# Patient Record
Sex: Female | Born: 1980
Health system: Southern US, Community
[De-identification: ages and names within clinical notes are randomized; demographics above are authoritative.]

## PROBLEM LIST (undated history)

## (undated) ENCOUNTER — Inpatient Hospital Stay (HOSPITAL_COMMUNITY): Payer: Self-pay

## (undated) DIAGNOSIS — Z8619 Personal history of other infectious and parasitic diseases: Secondary | ICD-10-CM

## (undated) DIAGNOSIS — Z87440 Personal history of urinary (tract) infections: Secondary | ICD-10-CM

## (undated) DIAGNOSIS — R011 Cardiac murmur, unspecified: Secondary | ICD-10-CM

## (undated) DIAGNOSIS — F32A Depression, unspecified: Secondary | ICD-10-CM

## (undated) DIAGNOSIS — T7840XA Allergy, unspecified, initial encounter: Secondary | ICD-10-CM

## (undated) DIAGNOSIS — F329 Major depressive disorder, single episode, unspecified: Secondary | ICD-10-CM

## (undated) DIAGNOSIS — O24419 Gestational diabetes mellitus in pregnancy, unspecified control: Secondary | ICD-10-CM

## (undated) HISTORY — DX: Personal history of urinary (tract) infections: Z87.440

## (undated) HISTORY — DX: Allergy, unspecified, initial encounter: T78.40XA

## (undated) HISTORY — DX: Depression, unspecified: F32.A

## (undated) HISTORY — DX: Personal history of other infectious and parasitic diseases: Z86.19

## (undated) HISTORY — DX: Cardiac murmur, unspecified: R01.1

## (undated) HISTORY — PX: WISDOM TOOTH EXTRACTION: SHX21

## (undated) HISTORY — DX: Major depressive disorder, single episode, unspecified: F32.9

## (undated) HISTORY — DX: Gestational diabetes mellitus in pregnancy, unspecified control: O24.419

---

## 2011-05-17 ENCOUNTER — Ambulatory Visit: Payer: Self-pay | Admitting: Family Medicine

## 2011-05-24 ENCOUNTER — Encounter: Payer: Self-pay | Admitting: Family Medicine

## 2011-05-24 ENCOUNTER — Ambulatory Visit (INDEPENDENT_AMBULATORY_CARE_PROVIDER_SITE_OTHER): Payer: BC Managed Care – PPO | Admitting: Family Medicine

## 2011-05-24 DIAGNOSIS — N809 Endometriosis, unspecified: Secondary | ICD-10-CM | POA: Insufficient documentation

## 2011-05-24 DIAGNOSIS — F329 Major depressive disorder, single episode, unspecified: Secondary | ICD-10-CM

## 2011-05-24 DIAGNOSIS — G43909 Migraine, unspecified, not intractable, without status migrainosus: Secondary | ICD-10-CM

## 2011-05-24 DIAGNOSIS — J029 Acute pharyngitis, unspecified: Secondary | ICD-10-CM

## 2011-05-24 DIAGNOSIS — J45909 Unspecified asthma, uncomplicated: Secondary | ICD-10-CM | POA: Insufficient documentation

## 2011-05-24 MED ORDER — CITALOPRAM HYDROBROMIDE 10 MG PO TABS: ORAL_TABLET | ORAL | Status: DC

## 2011-05-24 MED ORDER — LEVONORGESTREL-ETHINYL ESTRAD 90-20 MCG PO TABS: 1.0000 | ORAL_TABLET | Freq: Every day | ORAL | Status: DC

## 2011-05-24 NOTE — Progress Notes (Signed)
  Subjective:    Patient ID: Elizabeth [name], female    DOB: 1981/03/13, 30 y.o.   MRN: [id number]  HPI New patient to establish care. Patient just moved here from [place] and is teaching high school.  Past medical history significant for mild intermittent asthma, depression, migraine headaches, and endometriosis. Patient gives history that she was admitted twice this past summer in [place] with hives and swelling upper airway. Each time she improved with steroids. She saw allergist and had extensive testing. She had multiple allergies to pollen and cat dander and question of mold allergy. She thinks this may be environmental as she was living in older home with some mold issues. She's had no episodes since moving here. She takes [employer] and has prescription for EpiPen and albuterol inhaler as needed  Patient has depression treated with Celexa 15 mg daily and working well. Requesting refills. Migraine headaches and endometriosis and takes continuous birth control. Requesting refills until she can establish with gynecologist.  Acute issue of generally not doing well past couple weeks with intermittent mild sore throat. Has night sweats but no fever. Frequent strep in the past. Requesting strep screen.   Review of Systems  Constitutional: Positive for diaphoresis and fatigue. Negative for fever, chills and unexpected weight change.  Respiratory: Negative for cough and shortness of breath.   Cardiovascular: Negative for chest pain, palpitations and leg swelling.  Gastrointestinal: Negative for abdominal pain.  Genitourinary: Negative for dysuria.  Neurological: Negative for dizziness and headaches.  Psychiatric/Behavioral: Negative for dysphoric mood.       Objective:   Physical Exam  Constitutional: She is oriented to person, place, and time. She appears well-developed and well-nourished. No distress.  HENT:       Minimal posterior pharynx erythema. She has some trapped  particulate food right tonsillar region but no exudate  Neck: Neck supple.  Cardiovascular: Normal rate and regular rhythm.   No murmur heard. Pulmonary/Chest: Effort normal and breath sounds normal. No respiratory distress. She has no wheezes. She has no rales.  Musculoskeletal: She exhibits no edema.  Neurological: She is alert and oriented to person, place, and time.          Assessment & Plan:  #1 history of severe allergy with probable anaphylaxis, possibly to mold. Patient has seen allergist. Continue antihistamines. She has EpiPen which is in date. #2 history of recurrent depression. Refill Celexa #3 history of endometriosis. Refill Lybrel until she can establish with gynecologist. #4 history of migraine headaches #5 sore throat. Rule out strep.  Rapid strep negative. Treat with OTC meds as needed.

## 2011-10-20 ENCOUNTER — Ambulatory Visit (INDEPENDENT_AMBULATORY_CARE_PROVIDER_SITE_OTHER): Payer: BC Managed Care – PPO | Admitting: Family Medicine

## 2011-10-20 ENCOUNTER — Encounter: Payer: Self-pay | Admitting: Family Medicine

## 2011-10-20 DIAGNOSIS — T781XXA Other adverse food reactions, not elsewhere classified, initial encounter: Secondary | ICD-10-CM

## 2011-10-20 DIAGNOSIS — J45909 Unspecified asthma, uncomplicated: Secondary | ICD-10-CM

## 2011-10-20 DIAGNOSIS — Z91018 Allergy to other foods: Secondary | ICD-10-CM

## 2011-10-20 MED ORDER — CITALOPRAM HYDROBROMIDE 20 MG PO TABS: 20.0000 mg | ORAL_TABLET | Freq: Every day | ORAL | Status: DC

## 2011-10-20 NOTE — Patient Instructions (Signed)
Avoid dairy products for now. Qvar one puff twice daily and rinse mouth after use. Continue with Ventolin as needed Send for records from allergist in [place].

## 2011-10-20 NOTE — Progress Notes (Signed)
  Subjective:    Patient ID: Elizabeth [name], female    DOB: April 24, 1981, 31 y.o.   MRN: [id number]  HPI  Patient seen with possible allergy/asthma issue. Refer to prior note. She had episode this past summer in [place] where she consumed dairy products and had what sounds like posterior pharyngeal edema. She was given epinephrine and steroids but was not admitted and improved. She notices when she is ingests dairy products that she has sensation of swelling. With initial episode last summer she had generalized hives but is not having hives since then. She has not reported any angioedema type reaction with tongue or lips. Her episodes of dairy product intolerance also include frequent coughing. She has history of asthma which has been reportedly mild and intermittent. She's had about one episode per month where she has coughing episodes sometimes lasting several minutes to a couple hours. Never had any syncope.  She thinks she had spirometry this summer but was not sure. Currently takes Ventolin which does seem to help coughing episodes during attacks. She has generally tried to avoid dairy products. She saw allergist this summer in [place] and states she had extensive skin testing with no clear food allergies. She was allergic to cat dander, multiple pollens and possibly mold. We do not have those records at this time. Patient has epipen and takes antihistamines frequently.   Review of Systems  Constitutional: Negative for fever, chills and unexpected weight change.  HENT: Negative for sore throat, trouble swallowing and voice change.   Respiratory: Positive for cough and shortness of breath. Negative for wheezing.   Cardiovascular: Negative for chest pain, palpitations and leg swelling.  Gastrointestinal: Negative for abdominal pain.  Skin: Negative for rash.  Neurological: Negative for light-headedness.       Objective:   Physical Exam  Constitutional: She is oriented to person, place,  and time. She appears well-developed and well-nourished.  HENT:  Right Ear: External ear normal.  Left Ear: External ear normal.  Mouth/Throat: Oropharynx is clear and moist.  Neck: Neck supple. No thyromegaly present.  Cardiovascular: Normal rate and regular rhythm.   Pulmonary/Chest: Effort normal and breath sounds normal. No respiratory distress. She has no wheezes. She has no rales.  Musculoskeletal: She exhibits no edema.  Lymphadenopathy:    She has no cervical adenopathy.  Neurological: She is alert and oriented to person, place, and time.  Skin: No rash noted.          Assessment & Plan:  #1 possible food intolerance to dairy products. Patient describes recurrent sensation of posterior pharyngeal swelling with one especially bad episode last summer associated with generalized hives. Establish with local allergist. Send for old records. Keep EpiPen with her at all times along with Benadryl. Avoid dairy products for now #2 history of asthma. Question intermittent bronchospasm. Attempted spirometry with questionable validity-low FVC and FEV1. Start Qvar 40 mg one puff twice daily with instructions for use. Continue Ventolin as needed

## 2011-11-23 ENCOUNTER — Other Ambulatory Visit: Payer: Self-pay | Admitting: Family Medicine

## 2011-12-11 ENCOUNTER — Other Ambulatory Visit: Payer: Self-pay

## 2011-12-11 ENCOUNTER — Emergency Department (HOSPITAL_COMMUNITY): Payer: BC Managed Care – PPO

## 2011-12-11 ENCOUNTER — Encounter (HOSPITAL_COMMUNITY): Payer: Self-pay | Admitting: Physical Medicine and Rehabilitation

## 2011-12-11 ENCOUNTER — Emergency Department (HOSPITAL_COMMUNITY)
Admission: EM | Admit: 2011-12-11 | Discharge: 2011-12-12 | Disposition: A | Discharge status: Home / Self Care | Payer: BC Managed Care – PPO | Attending: Emergency Medicine | Admitting: Emergency Medicine

## 2011-12-11 DIAGNOSIS — F341 Dysthymic disorder: Secondary | ICD-10-CM | POA: Insufficient documentation

## 2011-12-11 DIAGNOSIS — R1013 Epigastric pain: Secondary | ICD-10-CM | POA: Insufficient documentation

## 2011-12-11 DIAGNOSIS — N390 Urinary tract infection, site not specified: Secondary | ICD-10-CM

## 2011-12-11 DIAGNOSIS — J45909 Unspecified asthma, uncomplicated: Secondary | ICD-10-CM | POA: Insufficient documentation

## 2011-12-11 DIAGNOSIS — Z79899 Other long term (current) drug therapy: Secondary | ICD-10-CM | POA: Insufficient documentation

## 2011-12-11 LAB — CBC
HCT: 34.2 % — ABNORMAL LOW (ref 36.0–46.0)
MCH: 29.7 pg (ref 26.0–34.0)
MCHC: 33.9 g/dL (ref 30.0–36.0)
MCV: 87.5 fL (ref 78.0–100.0)
Platelets: 201 10*3/uL (ref 150–400)
RDW: 12.3 % (ref 11.5–15.5)

## 2011-12-11 LAB — DIFFERENTIAL
Basophils Absolute: 0 10*3/uL (ref 0.0–0.1)
Basophils Relative: 1 % (ref 0–1)
Eosinophils Absolute: 0.2 10*3/uL (ref 0.0–0.7)
Eosinophils Relative: 3 % (ref 0–5)
Monocytes Absolute: 0.5 10*3/uL (ref 0.1–1.0)

## 2011-12-11 LAB — COMPREHENSIVE METABOLIC PANEL
AST: 22 U/L (ref 0–37)
Albumin: 3.5 g/dL (ref 3.5–5.2)
CO2: 23 mEq/L (ref 19–32)
Calcium: 9.2 mg/dL (ref 8.4–10.5)
Creatinine, Ser: 0.65 mg/dL (ref 0.50–1.10)
GFR calc non Af Amer: >90 mL/min (ref >90)
Total Protein: 6.8 g/dL (ref 6.0–8.3)

## 2011-12-11 LAB — URINALYSIS, ROUTINE W REFLEX MICROSCOPIC
Nitrite: NEGATIVE
Protein, ur: NEGATIVE mg/dL
Urobilinogen, UA: 0.2 mg/dL (ref 0.0–1.0)

## 2011-12-11 LAB — URINE MICROSCOPIC-ADD ON

## 2011-12-11 MED ORDER — DEXTROSE 5 % IV SOLN
1.0000 g | Freq: Once | INTRAVENOUS | Status: AC
  Administered 2011-12-11: 1 g via INTRAVENOUS
  Filled 2011-12-11 (×2): qty 10

## 2011-12-11 MED ORDER — GI COCKTAIL ~~LOC~~
30.0000 mL | Freq: Once | ORAL | Status: AC
  Administered 2011-12-11: 30 mL via ORAL
  Filled 2011-12-11: qty 30

## 2011-12-11 MED ORDER — PANTOPRAZOLE SODIUM 40 MG IV SOLR
40.0000 mg | Freq: Once | INTRAVENOUS | Status: AC
  Administered 2011-12-11: 40 mg via INTRAVENOUS
  Filled 2011-12-11: qty 40

## 2011-12-11 MED ORDER — ONDANSETRON HCL 4 MG/2ML IJ SOLN
4.0000 mg | Freq: Once | INTRAMUSCULAR | Status: AC
  Administered 2011-12-11: 4 mg via INTRAVENOUS
  Filled 2011-12-11: qty 2

## 2011-12-11 MED ORDER — HYDROMORPHONE HCL PF 1 MG/ML IJ SOLN
0.5000 mg | Freq: Once | INTRAMUSCULAR | Status: AC
  Administered 2011-12-11: 0.5 mg via INTRAVENOUS
  Filled 2011-12-11: qty 1

## 2011-12-11 MED ORDER — FENTANYL CITRATE 0.05 MG/ML IJ SOLN
50.0000 ug | Freq: Once | INTRAMUSCULAR | Status: AC
  Administered 2011-12-11: 50 ug via INTRAVENOUS
  Filled 2011-12-11: qty 2

## 2011-12-11 MED ORDER — HYDROCODONE-ACETAMINOPHEN 5-325 MG PO TABS: 1.0000 | ORAL_TABLET | Freq: Four times a day (QID) | ORAL | Status: AC | PRN

## 2011-12-11 MED ORDER — SUCRALFATE 1 G PO TABS: 1.0000 g | ORAL_TABLET | Freq: Four times a day (QID) | ORAL | Status: DC

## 2011-12-11 MED ORDER — METOCLOPRAMIDE HCL 10 MG PO TABS: 10.0000 mg | ORAL_TABLET | Freq: Four times a day (QID) | ORAL | Status: DC

## 2011-12-11 MED ORDER — OMEPRAZOLE 20 MG PO CPDR: 20.0000 mg | DELAYED_RELEASE_CAPSULE | Freq: Two times a day (BID) | ORAL | Status: DC

## 2011-12-11 NOTE — ED Provider Notes (Signed)
Medical screening examination/treatment/procedure(s) were performed by non-physician practitioner and as supervising physician I was immediately available for consultation/collaboration.   Dione Booze, MD 12/11/11 [phone]

## 2011-12-11 NOTE — ED Provider Notes (Signed)
She is more comfortable with medications but continues to have pain in epigastric and right upper quadrant area. [place] normal, blood studies stable. GI cocktail given. Dr. Preston Fleeting in to examine. Will discharge home with medications for pain and refer to GI for further evaluation.     Rodena Medin, PA-C 12/11/11 2330

## 2011-12-11 NOTE — ED Notes (Signed)
Pt presents to department for evaluation of epigastric pain. Onset yesterday while at work. States pain has become worse, now 7/10. Denies urinary symptoms. States nausea. Denies fever. Last BM this afternoon. She is alert and oriented x4. Also states "fluttering" sensation to chest. Denies chest pain at the time. Skin warm and dry. No signs of distress noted at the time.

## 2011-12-11 NOTE — ED Provider Notes (Signed)
31 year old female had presented with abdominal pain and evaluated by Dr. Alben Deeds. CBC and chemistry profile is normal and the ultrasound is negative. Patient is examined in maximum tenderness seems to be in the epigastric area. She will be given a trial of a GI cocktail and if she gets relief, sent home on a proton pump inhibitor with referral to gastroenterology.  Dione Booze, MD 12/11/11 [phone]

## 2011-12-11 NOTE — ED Provider Notes (Addendum)
History     CSN: [id number]  Arrival date & time 12/11/11  1551   First MD Initiated Contact with Patient 12/11/11 1740      Chief Complaint  Patient presents with  . Abdominal Pain    (Consider location/radiation/quality/duration/timing/severity/associated sxs/prior treatment) HPI Comments: Patient presents with 2 days of epigastric pain with associated nausea.  Patient had similar symptoms in the past several months ago and her doctors told her she might have an ulcer versus gallstones and recommended that if at worst to have it reevaluated.  He got worse 2 days ago which is why the patient presents here today.  She has not had any emesis.  She notes that the pain does not significantly improve or worsen with eating.  No fevers.  No changes in bowel habits or dysuria.  No chest pain or shortness of breath.  The patient's noted a fluttering sensation in her chest but no lightheadedness or dizziness.  No prior abdominal surgeries.  Patient is a 31 y.o. female presenting with abdominal pain. The history is provided by the patient.  Abdominal Pain The primary symptoms of the illness include abdominal pain and nausea. The primary symptoms of the illness do not include fever, fatigue, shortness of breath, vomiting, diarrhea, hematemesis, hematochezia, dysuria or vaginal discharge. The current episode started 2 days ago. The onset of the illness was gradual. The problem has been gradually worsening.  The patient states that she believes she is currently not pregnant. Symptoms associated with the illness do not include chills or back pain.    Past Medical History  Diagnosis Date  . Asthma   . Depression   . Allergy   . Heart murmur   . Migraine   . Endometriosis     History reviewed. No pertinent past surgical history.  Family History  Problem Relation Age of Onset  . Arthritis Father   . Hypertension Father   . Alcohol abuse Maternal Aunt   . Alcohol abuse Maternal Uncle   .  Alcohol abuse Paternal Aunt   . Alcohol abuse Paternal Uncle   . Hyperlipidemia Maternal Grandmother   . Diabetes Maternal Grandmother   . Arthritis Maternal Grandfather   . Cancer Maternal Grandfather     lung  . Hyperlipidemia Maternal Grandfather   . Miscarriages / Stillbirths Paternal Grandfather   . Hypertension Paternal Grandfather     History  Substance Use Topics  . Smoking status: Never Smoker   . Smokeless tobacco: Not on file  . Alcohol Use: Yes    OB History    Grav Para Term Preterm Abortions TAB SAB Ect Mult Living                  Review of Systems  Constitutional: Negative.  Negative for fever, chills and fatigue.  HENT: Negative.   Eyes: Negative.  Negative for discharge and redness.  Respiratory: Negative.  Negative for cough and shortness of breath.   Cardiovascular: Negative.  Negative for chest pain.  Gastrointestinal: Positive for nausea and abdominal pain. Negative for vomiting, diarrhea, hematochezia and hematemesis.  Genitourinary: Negative.  Negative for dysuria and vaginal discharge.  Musculoskeletal: Negative.  Negative for back pain.  Skin: Negative.  Negative for color change and rash.  Neurological: Negative.  Negative for syncope and headaches.  Hematological: Negative.  Negative for adenopathy.  Psychiatric/Behavioral: Negative.  Negative for confusion.  All other systems reviewed and are negative.    Allergies  Tramadol  Home Medications   Current  Outpatient Rx  Name Route Sig Dispense Refill  . ALBUTEROL SULFATE HFA 108 (90 BASE) MCG/ACT IN AERS Inhalation Inhale 2 puffs into the lungs every 6 (six) hours as needed. For wheezing    . BECLOMETHASONE DIPROPIONATE 40 MCG/ACT IN AERS Inhalation Inhale 2 puffs into the lungs daily.    Marland Kitchen CITALOPRAM HYDROBROMIDE 20 MG PO TABS Oral Take 1 tablet (20 mg total) by mouth daily. 30 tablet 11  . EPINEPHRINE 0.3 MG/0.3ML IJ DEVI Intramuscular Inject 0.3 mg into the muscle once.      Marland Kitchen  FEXOFENADINE HCL 180 MG PO TABS Oral Take 180 mg by mouth daily.      Marland Kitchen LEVONORGESTREL-ETHINYL ESTRAD 90-20 MCG PO TABS Oral Take 1 tablet by mouth daily.      BP 118/73  Pulse 96  Temp(Src) 97.8 F (36.6 C) (Oral)  Resp 20  SpO2 99%  Physical Exam  Nursing note and vitals reviewed. Constitutional: She is oriented to person, place, and time. She appears well-developed and well-nourished.  Non-toxic appearance. She does not have a sickly appearance.  HENT:  Head: Normocephalic and atraumatic.  Eyes: Conjunctivae, EOM and lids are normal. Pupils are equal, round, and reactive to light. No scleral icterus.  Neck: Trachea normal and normal range of motion. Neck supple.  Cardiovascular: Normal rate, regular rhythm and normal heart sounds.   Pulmonary/Chest: Effort normal and breath sounds normal. No respiratory distress. She has no wheezes. She has no rales.  Abdominal: Soft. Normal appearance. There is tenderness. There is no rebound, no guarding and no CVA tenderness.       Mild epigastric tenderness without rebound or guarding  Musculoskeletal: Normal range of motion.  Neurological: She is alert and oriented to person, place, and time. She has normal strength.  Skin: Skin is warm, dry and intact. No rash noted.  Psychiatric: She has a normal mood and affect. Her behavior is normal. Judgment and thought content normal.    ED Course  Procedures (including critical care time) Results for orders placed during the hospital encounter of 12/11/11  URINALYSIS, ROUTINE W REFLEX MICROSCOPIC      Component Value Range   Color, Urine YELLOW  YELLOW    APPearance CLOUDY (*) CLEAR    Specific Gravity, Urine 1.020  1.005 - 1.030    pH 7.5  5.0 - 8.0    Glucose, UA NEGATIVE  NEGATIVE (mg/dL)   Hgb urine dipstick NEGATIVE  NEGATIVE    Bilirubin Urine NEGATIVE  NEGATIVE    Ketones, ur NEGATIVE  NEGATIVE (mg/dL)   Protein, ur NEGATIVE  NEGATIVE (mg/dL)   Urobilinogen, UA 0.2  0.0 - 1.0 (mg/dL)     Nitrite NEGATIVE  NEGATIVE    Leukocytes, UA LARGE (*) NEGATIVE   POCT PREGNANCY, URINE      Component Value Range   Preg Test, Ur NEGATIVE  NEGATIVE   CBC      Component Value Range   WBC 6.9  4.0 - 10.5 (K/uL)   RBC 3.91  3.87 - 5.11 (MIL/uL)   Hemoglobin 11.6 (*) 12.0 - 15.0 (g/dL)   HCT 16.1 (*) 09.6 - 46.0 (%)   MCV 87.5  78.0 - 100.0 (fL)   MCH 29.7  26.0 - 34.0 (pg)   MCHC 33.9  30.0 - 36.0 (g/dL)   RDW 04.5  40.9 - 81.1 (%)   Platelets 201  150 - 400 (K/uL)  DIFFERENTIAL      Component Value Range   Neutrophils Relative 58  43 - 77 (%)   Neutro Abs 4.0  1.7 - 7.7 (K/uL)   Lymphocytes Relative 33  12 - 46 (%)   Lymphs Abs 2.3  0.7 - 4.0 (K/uL)   Monocytes Relative 7  3 - 12 (%)   Monocytes Absolute 0.5  0.1 - 1.0 (K/uL)   Eosinophils Relative 3  0 - 5 (%)   Eosinophils Absolute 0.2  0.0 - 0.7 (K/uL)   Basophils Relative 1  0 - 1 (%)   Basophils Absolute 0.0  0.0 - 0.1 (K/uL)  COMPREHENSIVE METABOLIC PANEL      Component Value Range   Sodium 137  135 - 145 (mEq/L)   Potassium 3.6  3.5 - 5.1 (mEq/L)   Chloride 104  96 - 112 (mEq/L)   CO2 23  19 - 32 (mEq/L)   Glucose, Bld 75  70 - 99 (mg/dL)   BUN 8  6 - 23 (mg/dL)   Creatinine, Ser 1.61  0.50 - 1.10 (mg/dL)   Calcium 9.2  8.4 - 09.6 (mg/dL)   Total Protein 6.8  6.0 - 8.3 (g/dL)   Albumin 3.5  3.5 - 5.2 (g/dL)   AST 22  0 - 37 (U/L)   ALT 28  0 - 35 (U/L)   Alkaline Phosphatase 71  39 - 117 (U/L)   Total Bilirubin 0.3  0.3 - 1.2 (mg/dL)   GFR calc non Af Amer >90  >90 (mL/min)   GFR calc Af Amer >90  >90 (mL/min)  LIPASE, BLOOD      Component Value Range   Lipase 31  11 - 59 (U/L)  URINE MICROSCOPIC-ADD ON      Component Value Range   Squamous Epithelial / LPF FEW (*) RARE    WBC, UA 7-10  <3 (WBC/hpf)   RBC / HPF 0-2  <3 (RBC/hpf)   Bacteria, UA MANY (*) RARE    Crystals CA OXALATE CRYSTALS (*) NEGATIVE    No results found.    MDM  Patient with epigastric pain with associated nausea that may be  related to possible GERD, peptic ulcer disease, pancreatitis, cholelithiasis or cholecystitis.  Patient is afebrile at this time.  I will obtain laboratory studies to assess for the above noted possibilities as well as an abdominal ultrasound to assess for gallstones or cholecystitis.        Nat Christen, MD 12/11/11 1749  Patient's urinalysis is consistent with UTI at this time so she's been given a dose of ceftriaxone here.  Patient's LFTs and lipase are normal.  I am obtaining an ultrasound to rule out gallstones at this time.  If the patient's pain and nausea is improved I anticipate she'll be able to be discharged with antibiotics for a UTI.  If her ultrasound is negative she will likely be able to go home on a PPI and followup with her primary care physician.  If her ultrasound shows gallstones she may need followup with her primary care physician as well as a Careers adviser as an outpatient.  Nat Christen, MD 12/11/11 1950

## 2011-12-11 NOTE — Discharge Instructions (Signed)
YOU CAN BE DISCHARGED HOME TO FOLLOW UP WITH DR. MANN FOR FURTHER EVALUATION OF ABDOMINAL PAIN. TAKE MEDICATIONS AS PRESCRIBED. RETURN HERE WITH ANY HIGH FEVER, SEVERE PAIN OR NEW CONCERN.  Abdominal Pain Abdominal pain can be caused by many things. Your caregiver decides the seriousness of your pain by an examination and possibly blood tests and X-rays. Many cases can be observed and treated at home. Most abdominal pain is not caused by a disease and will probably improve without treatment. However, in many cases, more time must pass before a clear cause of the pain can be found. Before that point, it may not be known if you need more testing, or if hospitalization or surgery is needed. HOME CARE INSTRUCTIONS   Do not take laxatives unless directed by your caregiver.   Take pain medicine only as directed by your caregiver.   Only take over-the-counter or prescription medicines for pain, discomfort, or fever as directed by your caregiver.   Try a clear liquid diet (broth, tea, or water) for as long as directed by your caregiver. Slowly move to a bland diet as tolerated.  SEEK IMMEDIATE MEDICAL CARE IF:   The pain does not go away.   You have a fever.   You keep throwing up (vomiting).   The pain is felt only in portions of the abdomen. Pain in the right side could possibly be appendicitis. In an adult, pain in the left lower portion of the abdomen could be colitis or diverticulitis.   You pass bloody or black tarry stools.  MAKE SURE YOU:   Understand these instructions.   Will watch your condition.   Will get help right away if you are not doing well or get worse.  Document Released: 06/02/2005 Document Revised: 08/12/2011 Document Reviewed: 04/10/2008 [facility] Patient Information 2012 [place], [place].

## 2012-01-04 ENCOUNTER — Encounter: Payer: Self-pay | Admitting: Family Medicine

## 2012-01-04 ENCOUNTER — Ambulatory Visit (INDEPENDENT_AMBULATORY_CARE_PROVIDER_SITE_OTHER): Payer: BC Managed Care – PPO | Admitting: Family Medicine

## 2012-01-04 VITALS — BP 120/80 | Temp 98.3°F | Wt 182.0 lb

## 2012-01-04 DIAGNOSIS — R1013 Epigastric pain: Secondary | ICD-10-CM

## 2012-01-04 DIAGNOSIS — J029 Acute pharyngitis, unspecified: Secondary | ICD-10-CM

## 2012-01-04 MED ORDER — ONDANSETRON 8 MG PO TBDP: 8.0000 mg | ORAL_TABLET | Freq: Three times a day (TID) | ORAL | Status: AC | PRN

## 2012-01-04 MED ORDER — PANTOPRAZOLE SODIUM 40 MG PO TBEC: 40.0000 mg | DELAYED_RELEASE_TABLET | Freq: Every day | ORAL | Status: DC

## 2012-01-04 NOTE — Progress Notes (Signed)
  Subjective:    Patient ID: Elizabeth [name], female    DOB: [birth year], 31 y.o.   MRN: [id number]  HPI  Patient seen for the following issues  She relates today history of sore throat. History of frequent strep pharyngitis in the past. She's had minimal nasal congestion. Occasional headaches. Intermittent dry cough. Sore throat symptoms are mild to moderate. Does not take any medications. No difficulty swallowing.  She relates some intermittent nausea and vomiting the past couple days. No diarrhea. Recurrent epigastric pain. Was seen emergency department earlier this month with similar epigastric pain. Ultrasound unremarkable. Patient placed on omeprazole and symptoms improved greatly but only took  this about one week. No history of peptic ulcer disease. No nonsteroidal use. No alcohol use. Nonsmoker. Denies any recent melena or hematemesis. No orthostasis. She was referred to gastroenterologist but never went  Past Medical History  Diagnosis Date  . Asthma   . Depression   . Allergy   . Heart murmur   . Migraine   . Endometriosis    No past surgical history on file.  reports that she has never smoked. She does not have any smokeless tobacco history on file. She reports that she drinks alcohol. She reports that she does not use illicit drugs. family history includes Alcohol abuse in her maternal aunt, maternal uncle, paternal aunt, and paternal uncle; Arthritis in her father and maternal grandfather; Cancer in her maternal grandfather; Diabetes in her maternal grandmother; Hyperlipidemia in her maternal grandfather and maternal grandmother; Hypertension in her father and paternal grandfather; and Miscarriages / [place] in her paternal grandfather. Allergies  Allergen Reactions  . Tramadol Nausea And Vomiting    GI upset      Review of Systems  Constitutional: Positive for appetite change. Negative for fever, chills and unexpected weight change.  HENT: Positive for congestion  and sore throat. Negative for trouble swallowing.   Respiratory: Positive for cough.   Gastrointestinal: Positive for nausea, vomiting and abdominal pain. Negative for diarrhea, constipation, blood in stool and abdominal distention.  Genitourinary: Negative for dysuria.  Skin: Negative for rash.       Objective:   Physical Exam  Constitutional: She is oriented to person, place, and time. She appears well-developed and well-nourished.  HENT:  Right Ear: External ear normal.  Left Ear: External ear normal.       Patient is a very small aphthous ulcer right soft palate region. No exudate  Neck: Neck supple.  Cardiovascular: Normal rate and regular rhythm.   Pulmonary/Chest: Effort normal and breath sounds normal. No respiratory distress. She has no wheezes. She has no rales.  Abdominal: Soft. Bowel sounds are normal. She exhibits no mass. There is no rebound and no guarding.       Patient has some midepigastric tenderness. No mass.  Lymphadenopathy:    She has no cervical adenopathy.  Neurological: She is alert and oriented to person, place, and time.  Skin: No rash noted.  Psychiatric: She has a normal mood and affect. Her behavior is normal.          Assessment & Plan:  #1 sore throat. Small aphthous ulcer. Rapid strep negative. Treat symptomatically. Avoid nonsteroidals for reasons that below #2 recurrent epigastric pain. Recent ultrasound unremarkable. No evidence for active bleeding. No specific risk factors for gastritis. Prescription for Zofran for nausea 8 mg every 8 hours as needed. Protonix 40 mg once daily. She is encouraged to go and schedule GI followup

## 2012-01-04 NOTE — Patient Instructions (Signed)
Canker Sores  Canker sores are painful, open sores on the inside of the mouth and cheek. They may be white or yellow. The sores usually heal in 1 to 2 weeks. Women are more likely than men to have recurrent canker sores. CAUSES The cause of canker sores is not well understood. More than one cause is likely. Canker sores do not appear to be caused by certain types of germs (viruses or bacteria). Canker sores may be caused by:  An allergic reaction to certain foods.   Digestive problems.   Not having enough vitamin B12, folic acid, and iron.   Female sex hormones. Sores may come only during certain phases of a menstrual cycle. Often, there is improvement during pregnancy.   Genetics. Some people seem to inherit canker sore problems.  Emotional stress and injuries to the mouth may trigger outbreaks, but not cause them.  DIAGNOSIS Canker sores are diagnosed by exam.  TREATMENT  Patients who have frequent bouts of canker sores may have cultures taken of the sores, blood tests, or allergy tests. This helps determine if their sores are caused by a poor diet, an allergy, or some other preventable or treatable disease.   Vitamins may prevent recurrences or reduce the severity of canker sores in people with poor nutrition.   Numbing ointments can relieve pain. These are available in drug stores without a prescription.   Anti-inflammatory steroid mouth rinses or gels may be prescribed by your caregiver for severe sores.   Oral steroids may be prescribed if you have severe, recurrent canker sores. These strong medicines can cause many side effects and should be used only under the close direction of a dentist or physician.   Mouth rinses containing the antibiotic medicine may be prescribed. They may lessen symptoms and speed healing.  Healing usually happens in about 1 or 2 weeks with or without treatment. Certain antibiotic mouth rinses given to pregnant women and young children can permanently  stain teeth. Talk to your caregiver about your treatment. HOME CARE INSTRUCTIONS   Avoid foods that cause canker sores for you.   Avoid citrus juices, spicy or salty foods, and coffee until the sores are healed.   Use a soft-bristled toothbrush.   Chew your food carefully to avoid biting your cheek.   Apply topical numbing medicine to the sore to help relieve pain.   Apply a thin paste of baking soda and water to the sore to help heal the sore.   Only use mouth rinses or medicines for pain or discomfort as directed by your caregiver.  SEEK MEDICAL CARE IF:   Your symptoms are not better in 1 week.   Your sores are still present after 2 weeks.   Your sores are very painful.   You have trouble breathing or swallowing.   Your sores come back frequently.  Document Released: 12/18/2010 Document Revised: 08/12/2011 Document Reviewed: 12/18/2010 [facility] Patient Information 2012 [place], [place].  Schedule follow up with gastroenterologist. Follow up immediately for any melena, or vomiting blood or any progressive abdominal pain.

## 2012-03-10 ENCOUNTER — Telehealth: Payer: Self-pay | Admitting: Family Medicine

## 2012-03-10 MED ORDER — CITALOPRAM HYDROBROMIDE 20 MG PO TABS: 20.0000 mg | ORAL_TABLET | Freq: Every day | ORAL | Status: DC

## 2012-03-10 NOTE — Telephone Encounter (Signed)
Pt informed Rx called into pharamacy requested

## 2012-03-10 NOTE — Telephone Encounter (Signed)
OK to refill

## 2012-03-10 NOTE — Telephone Encounter (Signed)
Patient called stating that she is on vacation and has left her rx for celexa 20mg  at home and would like to know if the MD would call her in a refill to [facility] [address] or [address] [place], [place]. Ph. [phone]. Please advise and inform patient of advise as she will be leaving tomorrow to go to another state.

## 2012-06-21 ENCOUNTER — Other Ambulatory Visit: Payer: Self-pay | Admitting: Family Medicine

## 2012-07-24 IMAGING — US US ABDOMEN COMPLETE
1 series · 14 of 25 positions shown · non-contrast
Comparison: None.

CLINICAL DATA: Epigastric abdominal pain, nausea

COMPLETE ABDOMINAL ULTRASOUND

[Series 1: us abdomen complete · 0.33mm/px · 14 of 65 slices shown]
[im 1/65]
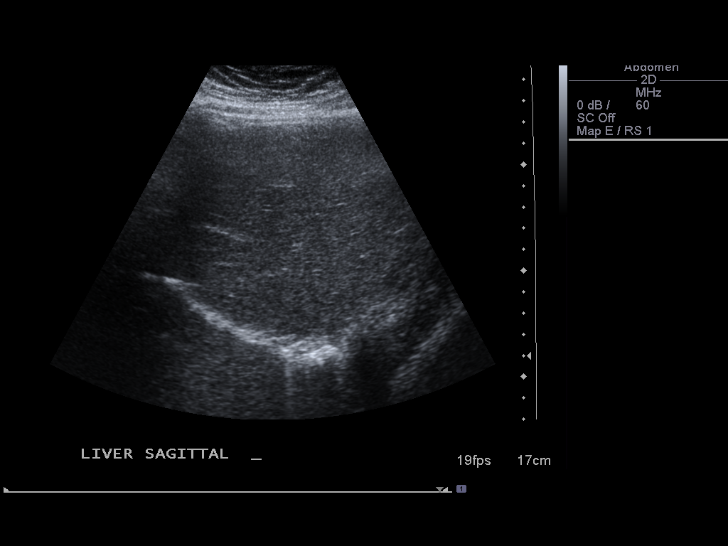
[im 6/65]
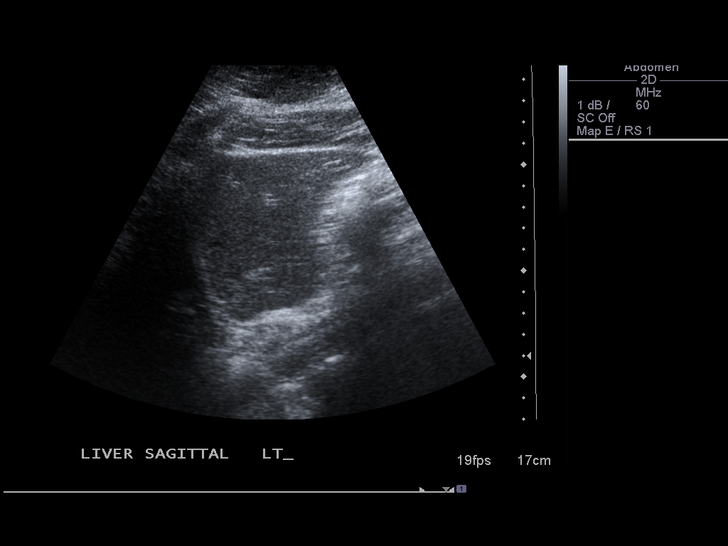
[im 11/65]
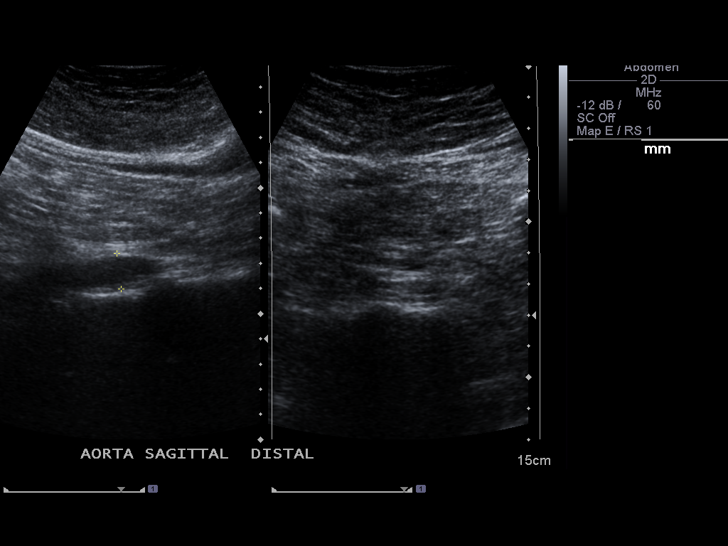
[im 17/65]
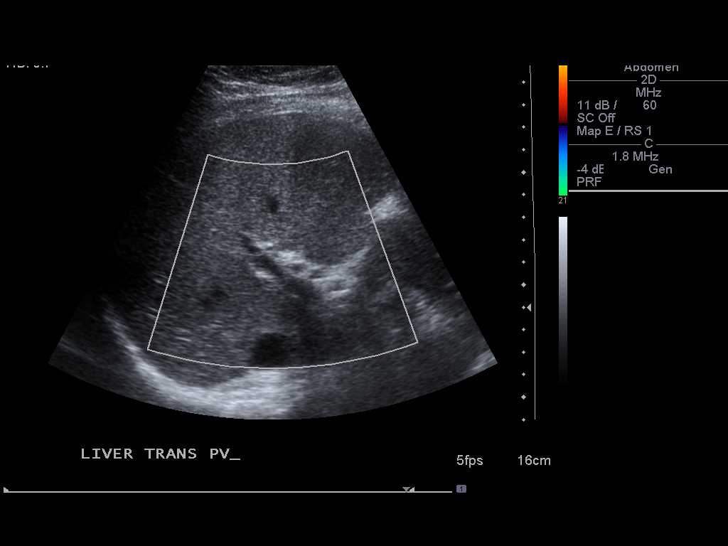
[im 22/65]
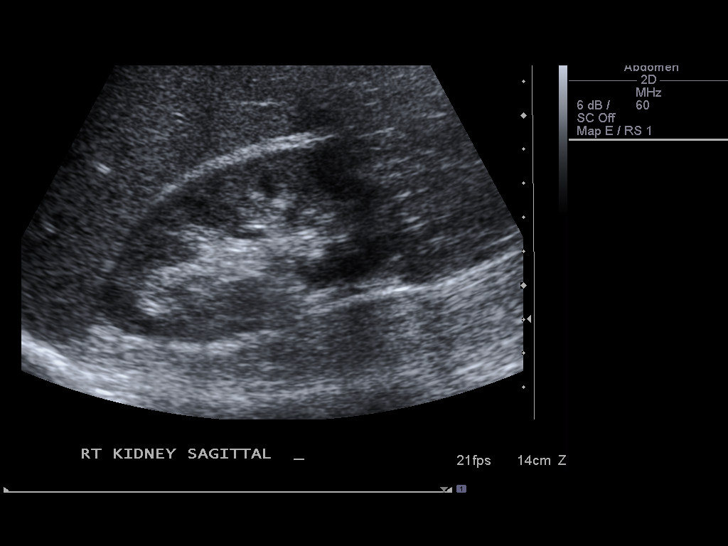
[im 25/65]
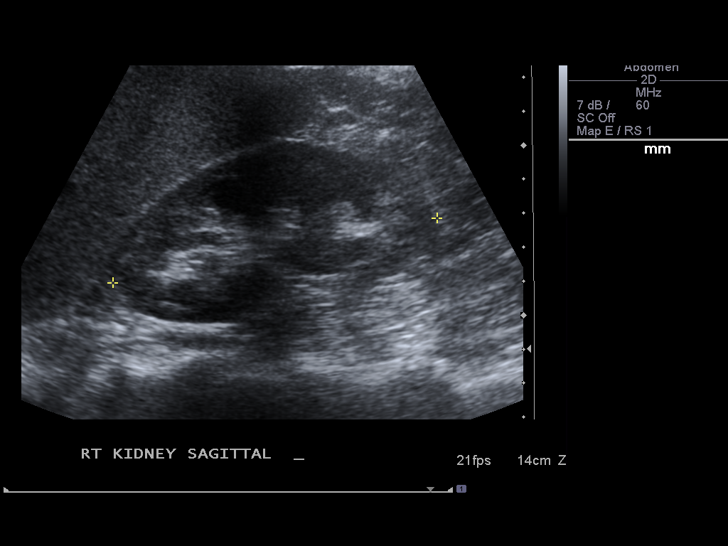
[im 30/65]
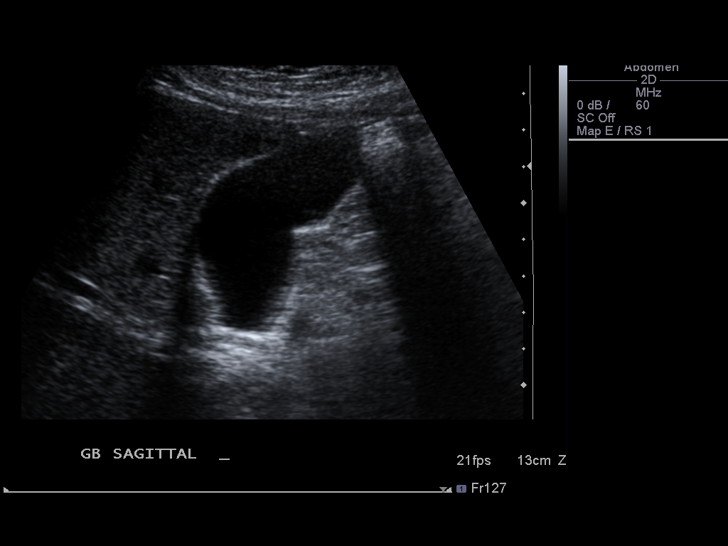
[im 35/65]
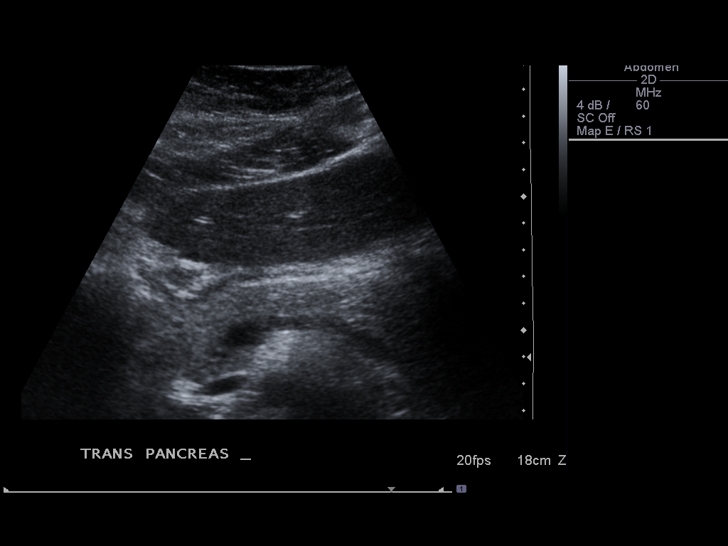
[im 41/65]
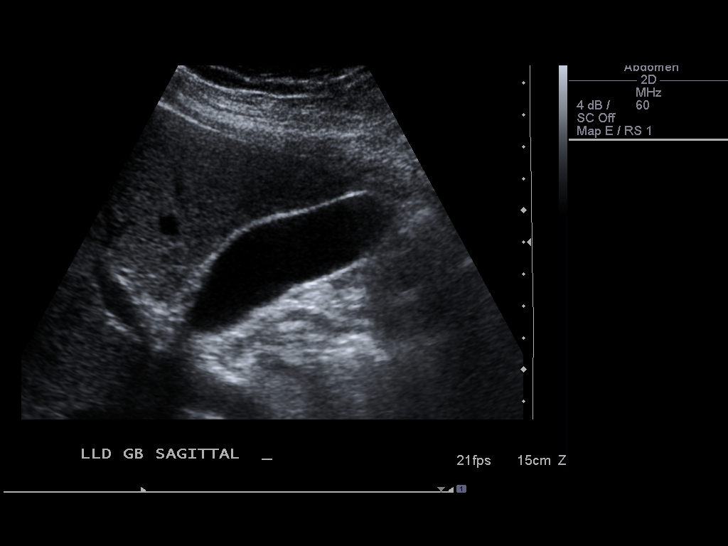
[im 43/65]
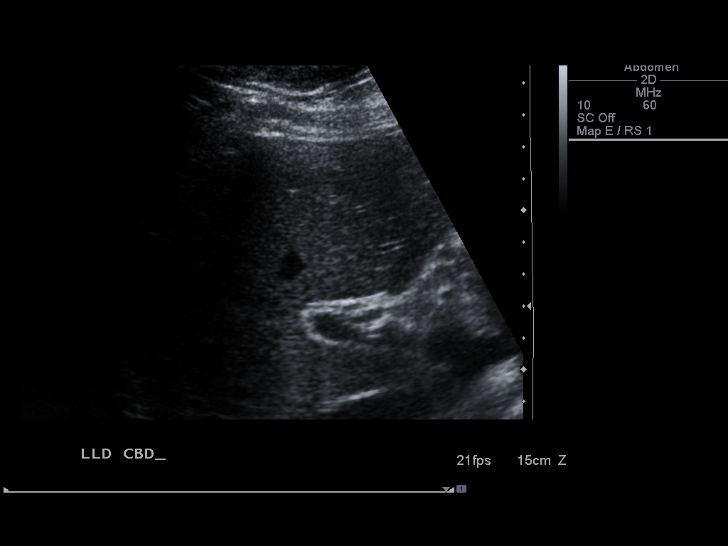
[im 49/65]
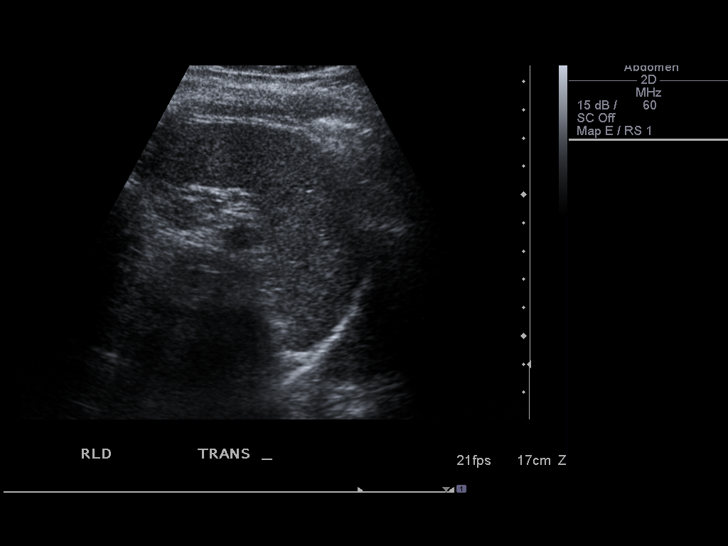
[im 54/65]
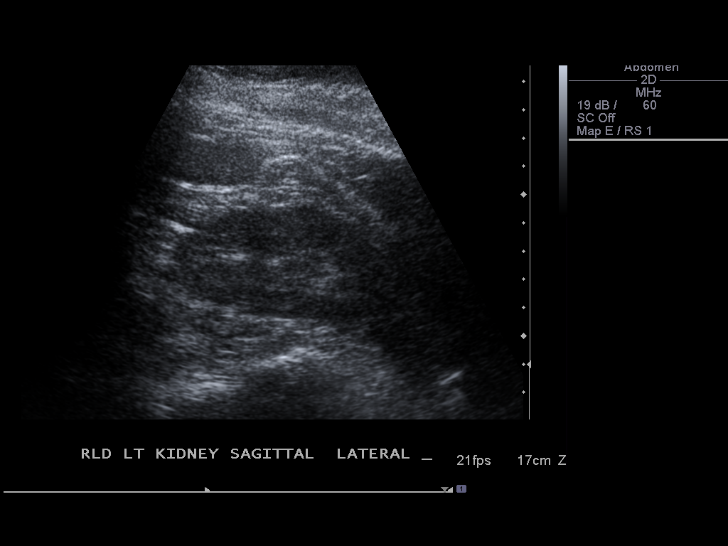
[im 59/65]
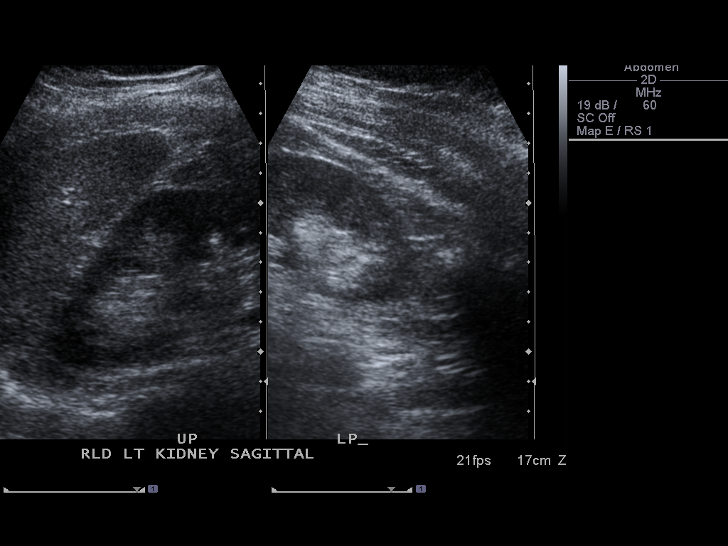
[im 65/65]
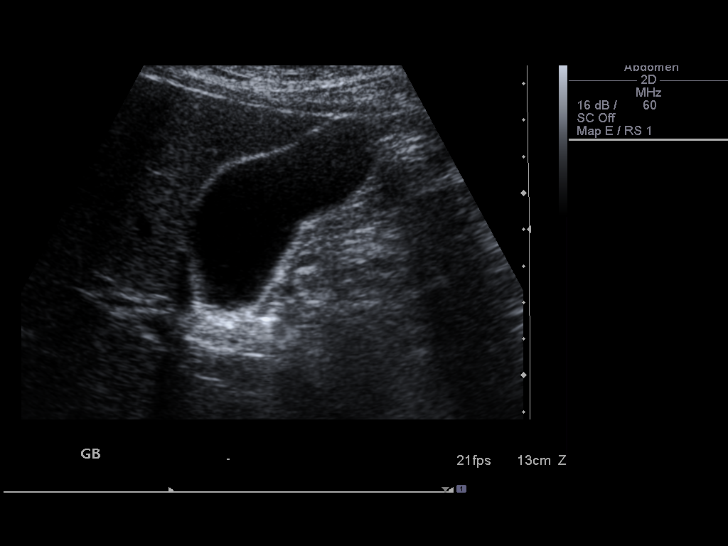

[14 of 25 positions shown; findings below may reference images not displayed]

FINDINGS: Gallbladder:  No gallstones, gallbladder wall thickening, or
pericholecystic fluid.  Negative sonographic Murphy's sign.

Common bile duct:  Measures 3 mm.

Liver:  No focal lesion identified.  Within normal limits in
parenchymal echogenicity.

IVC:  Appears normal.

Pancreas:  Incompletely visualized but grossly unremarkable.

Spleen:  Measures 9.0 cm.  Suspected splenule in the splenic hilum.

Right Kidney:  Measures 10.0 cm.  No mass or hydronephrosis.

Left Kidney:  Measures 10.5 cm.  No mass or hydronephrosis.

Abdominal aorta:  No aneurysm identified.
IMPRESSION: Negative abdominal ultrasound.

## 2012-08-17 ENCOUNTER — Other Ambulatory Visit: Payer: Self-pay | Admitting: Family Medicine

## 2012-11-10 ENCOUNTER — Other Ambulatory Visit: Payer: Self-pay | Admitting: Family Medicine

## 2013-01-11 LAB — OB RESULTS CONSOLE HIV ANTIBODY (ROUTINE TESTING): HIV: NONREACTIVE

## 2013-01-11 LAB — OB RESULTS CONSOLE ANTIBODY SCREEN: Antibody Screen: NEGATIVE

## 2013-01-11 LAB — OB RESULTS CONSOLE ABO/RH: RH Type: POSITIVE

## 2013-01-11 LAB — OB RESULTS CONSOLE RUBELLA ANTIBODY, IGM: Rubella: IMMUNE

## 2013-01-13 ENCOUNTER — Other Ambulatory Visit: Payer: Self-pay | Admitting: Family Medicine

## 2013-05-05 ENCOUNTER — Inpatient Hospital Stay (HOSPITAL_COMMUNITY)
Admission: AD | Admit: 2013-05-05 | Discharge: 2013-05-05 | Disposition: A | Discharge status: Home / Self Care | Payer: BC Managed Care – PPO | Source: Ambulatory Visit | Attending: Obstetrics and Gynecology | Admitting: Obstetrics and Gynecology

## 2013-05-05 ENCOUNTER — Encounter (HOSPITAL_COMMUNITY): Payer: Self-pay | Admitting: *Deleted

## 2013-05-05 DIAGNOSIS — O2390 Unspecified genitourinary tract infection in pregnancy, unspecified trimester: Secondary | ICD-10-CM | POA: Insufficient documentation

## 2013-05-05 DIAGNOSIS — O368190 Decreased fetal movements, unspecified trimester, not applicable or unspecified: Secondary | ICD-10-CM | POA: Insufficient documentation

## 2013-05-05 DIAGNOSIS — B373 Candidiasis of vulva and vagina: Secondary | ICD-10-CM

## 2013-05-05 DIAGNOSIS — O368120 Decreased fetal movements, second trimester, not applicable or unspecified: Secondary | ICD-10-CM

## 2013-05-05 DIAGNOSIS — B3731 Acute candidiasis of vulva and vagina: Secondary | ICD-10-CM | POA: Insufficient documentation

## 2013-05-05 LAB — WET PREP, GENITAL: Trich, Wet Prep: NONE SEEN

## 2013-05-05 NOTE — MAU Note (Signed)
Pt C/O spotting yesterday, none today, had been taking vaginal yeast infection medication.  Decreased fetal movement since last night.

## 2013-05-05 NOTE — MAU Provider Note (Signed)
History     CSN: [id number]  Arrival date and time: 05/05/13 1191   First Provider Initiated Contact with Patient 05/05/13 1106      Chief Complaint  Patient presents with  . Decreased Fetal Movement   HPI Elizabeth [name] 32 y.o. [redacted]w[redacted]d  Comes to MAU with decreased fetal movement and one episode of a small amount of bright red bleeding.  Is currently treating for a yeast infection.  No bleeding today but was worried that the baby was not moving well.  OB History   Grav Para Term Preterm Abortions TAB SAB Ect Mult Living   1               Past Medical History  Diagnosis Date  . Asthma   . Depression   . Allergy   . Heart murmur   . Migraine   . Endometriosis     Past Surgical History  Procedure Laterality Date  . Wisdom tooth extraction      Family History  Problem Relation Age of Onset  . Arthritis Father   . Hypertension Father   . Alcohol abuse Maternal Aunt   . Alcohol abuse Maternal Uncle   . Alcohol abuse Paternal Aunt   . Alcohol abuse Paternal Uncle   . Hyperlipidemia Maternal Grandmother   . Diabetes Maternal Grandmother   . Arthritis Maternal Grandfather   . Cancer Maternal Grandfather     lung  . Hyperlipidemia Maternal Grandfather   . Miscarriages / Stillbirths Paternal Grandfather   . Hypertension Paternal Grandfather     History  Substance Use Topics  . Smoking status: Never Smoker   . Smokeless tobacco: Not on file  . Alcohol Use: No    Allergies:  Allergies  Allergen Reactions  . Tramadol Nausea And Vomiting    GI upset    Prescriptions prior to admission  Medication Sig Dispense Refill  . calcium carbonate (TUMS EX) 750 MG chewable tablet Chew 2 tablets by mouth daily as needed for heartburn.      . Prenatal Vit-Fe Fumarate-FA (PRENATAL MULTIVITAMIN) TABS tablet Take 1 tablet by mouth daily at 12 noon.      Marland Kitchen terconazole (TERAZOL 7) 0.4 % vaginal cream Place 1 applicator vaginally at bedtime.      Marland Kitchen albuterol (PROVENTIL  HFA;VENTOLIN HFA) 108 (90 BASE) MCG/ACT inhaler Inhale 2 puffs into the lungs every 6 (six) hours as needed. For wheezing      . beclomethasone (QVAR) 40 MCG/ACT inhaler Inhale 2 puffs into the lungs daily.      Marland Kitchen EPINEPHrine (EPI-PEN) 0.3 mg/0.3 mL DEVI Inject 0.3 mg into the muscle once.          Review of Systems  Constitutional: Negative for fever.  Gastrointestinal: Negative for nausea, vomiting and abdominal pain.  Genitourinary:       No vaginal discharge. Vaginal bleeding. No dysuria.     Physical Exam   Blood pressure 131/66, pulse 90, temperature 97.1 F (36.2 C), temperature source Oral, resp. rate 16.  Physical Exam  Nursing note and vitals reviewed. Constitutional: She is oriented to person, place, and time. She appears well-developed and well-nourished.  HENT:  Head: Normocephalic.  Eyes: EOM are normal.  Neck: Neck supple.  GI: Soft. There is no tenderness. There is no rebound and no guarding.  Monitor strip reviewed.  FHT 140 baseline and 10X10 accels noted.  Client is now feeling the baby move well.  Genitourinary:  Speculum exam: Vagina - Small amount of  creamy discharge, no odor Cervix - No contact bleeding Bimanual exam: Cervix closed and thick Uterus gravid wet prep done Chaperone present for exam.  Musculoskeletal: Normal range of motion.  Neurological: She is alert and oriented to person, place, and time.  Skin: Skin is warm and dry.  Psychiatric: She has a normal mood and affect.    MAU Course  Procedures  MDM Results for orders placed during the hospital encounter of 05/05/13 (from the past 24 hour(s))  WET PREP, GENITAL     Status: Abnormal   Collection Time    05/05/13 11:19 AM      Result Value Range   Yeast Wet Prep HPF POC NONE SEEN  NONE SEEN   Trich, Wet Prep NONE SEEN  NONE SEEN   Clue Cells Wet Prep HPF POC NONE SEEN  NONE SEEN   WBC, Wet Prep HPF POC FEW (*) NONE SEEN    Assessment and Plan  Fetal movement appropriate for  gestational age. No vaginal bleeding Current yeast infection with prior prescribed treatment.  Plan Follow up in the office Call your doctor if there is decreased movement or you have any additional vaginal bleeding.  [name] 05/05/2013, 12:43 PM

## 2013-06-20 ENCOUNTER — Encounter: Payer: BC Managed Care – PPO | Attending: Obstetrics and Gynecology

## 2013-06-20 VITALS — Ht 62.0 in | Wt 197.1 lb

## 2013-06-20 DIAGNOSIS — Z713 Dietary counseling and surveillance: Secondary | ICD-10-CM | POA: Insufficient documentation

## 2013-06-20 DIAGNOSIS — O99810 Abnormal glucose complicating pregnancy: Secondary | ICD-10-CM | POA: Insufficient documentation

## 2013-06-25 ENCOUNTER — Other Ambulatory Visit (HOSPITAL_COMMUNITY): Payer: Self-pay | Admitting: Obstetrics and Gynecology

## 2013-06-25 DIAGNOSIS — R109 Unspecified abdominal pain: Secondary | ICD-10-CM

## 2013-06-26 ENCOUNTER — Ambulatory Visit (HOSPITAL_COMMUNITY)
Admission: RE | Admit: 2013-06-26 | Discharge: 2013-06-26 | Disposition: A | Discharge status: Home / Self Care | Payer: BC Managed Care – PPO | Source: Ambulatory Visit | Attending: Obstetrics and Gynecology | Admitting: Obstetrics and Gynecology

## 2013-06-26 DIAGNOSIS — R109 Unspecified abdominal pain: Secondary | ICD-10-CM | POA: Insufficient documentation

## 2013-06-26 DIAGNOSIS — O99891 Other specified diseases and conditions complicating pregnancy: Secondary | ICD-10-CM | POA: Insufficient documentation

## 2013-08-03 ENCOUNTER — Inpatient Hospital Stay (HOSPITAL_COMMUNITY)
Admission: AD | Admit: 2013-08-03 | Discharge: 2013-08-03 | Disposition: A | Discharge status: Home / Self Care | Payer: 59 | Source: Ambulatory Visit | Attending: Obstetrics and Gynecology | Admitting: Obstetrics and Gynecology

## 2013-08-03 DIAGNOSIS — O99810 Abnormal glucose complicating pregnancy: Secondary | ICD-10-CM | POA: Insufficient documentation

## 2013-08-03 NOTE — Progress Notes (Signed)
Reactive NST completed.  Dr Langston Masker called- doing procedure- will call back after.

## 2013-08-03 NOTE — Progress Notes (Signed)
Pt presents as MAU overflow for biweekly NST due to Gestational Diabetes on Glyburide.  Glyburide was 2.5 mg po in evening but has recently cut that dose in half.  States CBG's WNL except for some elevated fasting levels.

## 2013-08-03 NOTE — Progress Notes (Signed)
NST called to Dr Langston Masker- pt d/c home undelivered with labor precautions, kick counts, office appt sched for Mon.

## 2013-08-03 NOTE — Progress Notes (Signed)
Patient comes in today for NST secondary to A2DM.  She has no c/o.  Last NST was Tuesday and non-reactive for which she had BPP 8/10.    Reactive NST today  32yo G1 at [redacted]w[redacted]d with A2DM for monitoring -F/U in office as scheduled -Kick counts

## 2013-08-14 ENCOUNTER — Encounter (HOSPITAL_COMMUNITY): Payer: Self-pay | Admitting: *Deleted

## 2013-08-14 ENCOUNTER — Inpatient Hospital Stay (HOSPITAL_COMMUNITY)
Admission: AD | Admit: 2013-08-14 | Discharge: 2013-08-14 | Disposition: A | Discharge status: Home / Self Care | Payer: 59 | Source: Ambulatory Visit | Attending: Obstetrics and Gynecology | Admitting: Obstetrics and Gynecology

## 2013-08-14 DIAGNOSIS — O99891 Other specified diseases and conditions complicating pregnancy: Secondary | ICD-10-CM | POA: Insufficient documentation

## 2013-08-14 DIAGNOSIS — K5289 Other specified noninfective gastroenteritis and colitis: Secondary | ICD-10-CM | POA: Insufficient documentation

## 2013-08-14 DIAGNOSIS — O212 Late vomiting of pregnancy: Secondary | ICD-10-CM | POA: Insufficient documentation

## 2013-08-14 DIAGNOSIS — M549 Dorsalgia, unspecified: Secondary | ICD-10-CM | POA: Insufficient documentation

## 2013-08-14 DIAGNOSIS — K529 Noninfective gastroenteritis and colitis, unspecified: Secondary | ICD-10-CM

## 2013-08-14 LAB — COMPREHENSIVE METABOLIC PANEL
AST: 18 U/L (ref 0–37)
Albumin: 2.4 g/dL — ABNORMAL LOW (ref 3.5–5.2)
Alkaline Phosphatase: 136 U/L — ABNORMAL HIGH (ref 39–117)
BUN: 8 mg/dL (ref 6–23)
CO2: 21 mEq/L (ref 19–32)
Chloride: 102 mEq/L (ref 96–112)
GFR calc non Af Amer: >90 mL/min (ref >90)
Potassium: 3.5 mEq/L (ref 3.5–5.1)
Total Bilirubin: 0.6 mg/dL (ref 0.3–1.2)

## 2013-08-14 LAB — URINALYSIS, ROUTINE W REFLEX MICROSCOPIC
Glucose, UA: NEGATIVE mg/dL
Ketones, ur: NEGATIVE mg/dL
Protein, ur: NEGATIVE mg/dL
Urobilinogen, UA: 0.2 mg/dL (ref 0.0–1.0)

## 2013-08-14 LAB — URINE MICROSCOPIC-ADD ON

## 2013-08-14 MED ORDER — DEXTROSE 5 % IN LACTATED RINGERS IV BOLUS
1000.0000 mL | Freq: Once | INTRAVENOUS | Status: AC
  Administered 2013-08-14: 1000 mL via INTRAVENOUS

## 2013-08-14 MED ORDER — ONDANSETRON 4 MG PO TBDP: 4.0000 mg | ORAL_TABLET | Freq: Three times a day (TID) | ORAL | Status: DC | PRN

## 2013-08-14 MED ORDER — PROMETHAZINE HCL 25 MG PO TABS: 25.0000 mg | ORAL_TABLET | Freq: Four times a day (QID) | ORAL | Status: DC | PRN

## 2013-08-14 MED ORDER — PROMETHAZINE HCL 25 MG/ML IJ SOLN
12.5000 mg | Freq: Once | INTRAMUSCULAR | Status: AC
  Administered 2013-08-14: 12.5 mg via INTRAVENOUS
  Filled 2013-08-14: qty 1

## 2013-08-14 NOTE — MAU Note (Addendum)
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

## 2013-08-14 NOTE — MAU Note (Signed)
Arrival @ [phone] via EMS, no bleeding, good FM, [place] started 0830 every 2 minutes

## 2013-08-14 NOTE — MAU Note (Signed)
During SVE and palpating for contraction, pt experiencing pain in mid upper abd. cx closed, hx gallbladder US  2 months ago

## 2013-08-14 NOTE — MAU Provider Note (Signed)
History     CSN: [id number]  Arrival date and time: 08/14/13 0943   None     Chief Complaint  Patient presents with  . Labor Eval   HPI [name] is a 32 y.o. female G1P0 at [redacted]w[redacted]d who presents with nausea, vomiting, back pain and ?contractions. Pt was teaching and started feeling nauseous; pt began feeling very faint. She started feeling significant bilateral back pain. She started shaking and at the time a co-worker checked her blood sugar and it was 105. She vomited 8-9 times, pt has not vomited since she has been here. Pt's co-worker called 911 and patient presented via EMS. Denies diarrhea. She reports good fetal movement, denies LOF, vaginal bleeding, vaginal itching/burning, urinary symptoms, h/a.   OB History   Grav Para Term Preterm Abortions TAB SAB Ect Mult Living   1               Past Medical History  Diagnosis Date  . Asthma   . Depression   . Allergy   . Heart murmur   . Migraine   . Endometriosis   . Gestational diabetes     Past Surgical History  Procedure Laterality Date  . Wisdom tooth extraction      Family History  Problem Relation Age of Onset  . Arthritis Father   . Hypertension Father   . Alcohol abuse Maternal Aunt   . Alcohol abuse Maternal Uncle   . Alcohol abuse Paternal Aunt   . Alcohol abuse Paternal Uncle   . Hyperlipidemia Maternal Grandmother   . Diabetes Maternal Grandmother   . Arthritis Maternal Grandfather   . Cancer Maternal Grandfather     lung  . Hyperlipidemia Maternal Grandfather   . Miscarriages / Stillbirths Paternal Grandfather   . Hypertension Paternal Grandfather     History  Substance Use Topics  . Smoking status: Never Smoker   . Smokeless tobacco: Not on file  . Alcohol Use: No    Allergies:  Allergies  Allergen Reactions  . Tramadol Nausea And Vomiting    GI upset    Prescriptions prior to admission  Medication Sig Dispense Refill  . calcium carbonate (TUMS EX) 750 MG chewable  tablet Chew 2 tablets by mouth daily as needed for heartburn.      . glyBURIDE (DIABETA) 1.25 MG tablet Take 1.25 mg by mouth daily.      . Prenatal Vit-Fe Fumarate-FA (PRENATAL MULTIVITAMIN) TABS tablet Take 1 tablet by mouth daily at 12 noon.      Marland Kitchen albuterol (PROVENTIL HFA;VENTOLIN HFA) 108 (90 BASE) MCG/ACT inhaler Inhale 2 puffs into the lungs every 6 (six) hours as needed. For wheezing      . EPINEPHrine (EPI-PEN) 0.3 mg/0.3 mL DEVI Inject 0.3 mg into the muscle once.         Results for orders placed during the hospital encounter of 08/14/13 (from the past 24 hour(s))  URINALYSIS, ROUTINE W REFLEX MICROSCOPIC     Status: Abnormal   Collection Time    08/14/13  9:50 AM      Result Value Range   Color, Urine YELLOW  YELLOW   APPearance HAZY (*) CLEAR   Specific Gravity, Urine 1.015  1.005 - 1.030   pH 6.0  5.0 - 8.0   Glucose, UA NEGATIVE  NEGATIVE mg/dL   Hgb urine dipstick NEGATIVE  NEGATIVE   Bilirubin Urine NEGATIVE  NEGATIVE   Ketones, ur NEGATIVE  NEGATIVE mg/dL   Protein, ur NEGATIVE  NEGATIVE mg/dL   Urobilinogen, UA 0.2  0.0 - 1.0 mg/dL   Nitrite NEGATIVE  NEGATIVE   Leukocytes, UA MODERATE (*) NEGATIVE  URINE MICROSCOPIC-ADD ON     Status: Abnormal   Collection Time    08/14/13  9:50 AM      Result Value Range   Squamous Epithelial / LPF MANY (*) RARE   WBC, UA 3-6  <3 WBC/hpf   RBC / HPF 0-2  <3 RBC/hpf   Bacteria, UA RARE  RARE   Urine-Other MUCOUS PRESENT    COMPREHENSIVE METABOLIC PANEL     Status: Abnormal   Collection Time    08/14/13 12:42 PM      Result Value Range   Sodium 133 (*) 135 - 145 mEq/L   Potassium 3.5  3.5 - 5.1 mEq/L   Chloride 102  96 - 112 mEq/L   CO2 21  19 - 32 mEq/L   Glucose, Bld 224 (*) 70 - 99 mg/dL   BUN 8  6 - 23 mg/dL   Creatinine, Ser 7.84  0.50 - 1.10 mg/dL   Calcium 8.4  8.4 - 69.6 mg/dL   Total Protein 6.2  6.0 - 8.3 g/dL   Albumin 2.4 (*) 3.5 - 5.2 g/dL   AST 18  0 - 37 U/L   ALT 18  0 - 35 U/L   Alkaline Phosphatase  136 (*) 39 - 117 U/L   Total Bilirubin 0.6  0.3 - 1.2 mg/dL   GFR calc non Af Amer >90  >90 mL/min   GFR calc Af Amer >90  >90 mL/min    Review of Systems  Constitutional: Positive for fever and chills.  Gastrointestinal: Positive for nausea, vomiting and abdominal pain. Negative for heartburn, diarrhea and constipation.  Genitourinary: Negative for dysuria, urgency, frequency and hematuria.       No vaginal discharge. No vaginal bleeding. No dysuria.   Neurological: Positive for dizziness. Negative for headaches.   Physical Exam   Blood pressure 113/61, pulse 82, temperature 97.8 F (36.6 C), temperature source Oral, resp. rate 18, height 5\' 2"  (1.575 m), weight 87.998 kg (194 lb).  Physical Exam  Constitutional: She appears well-developed and well-nourished. No distress.  HENT:  Head: Normocephalic.  Eyes: Pupils are equal, round, and reactive to light.  Neck: Neck supple.  Respiratory: Effort normal.  GI: Soft.  Skin: She is diaphoretic.    Fetal Tracing: Baseline: 130 bpm Variability: Moderate  Accelerations: 15x15 Decelerations: None  Toco: UI   Dilation: Fingertip Effacement (%): Thick Cervical Position: Posterior Station: -2 Exam by:: Gwenyth Bender, RNC    MAU Course  Procedures None  MDM D5LR bolus Phenergan 12.5 mg IM CMET  Consulted with Dr. Henderson Cloud; ok to discharge patient home with nausea medication.  Assessment and Plan   A: 1. Gastroenteritis, acute     P: Discharge home Stay home from work 24-48 hours  RX: Zofran        Phenergan  Return to MAU as needed, if symptoms worsen Follow up with physician as scheduled.  Limit sugar intake throughout the rest of the day; smart water is a good option for hydration.   [name] IRENE NP  08/14/2013, 5:15 PM

## 2013-08-23 ENCOUNTER — Telehealth (HOSPITAL_COMMUNITY): Payer: Self-pay | Admitting: *Deleted

## 2013-08-23 ENCOUNTER — Encounter (HOSPITAL_COMMUNITY): Payer: Self-pay | Admitting: *Deleted

## 2013-08-23 LAB — OB RESULTS CONSOLE GBS: GBS: NEGATIVE

## 2013-08-23 NOTE — Telephone Encounter (Signed)
Preadmission screen  

## 2013-08-24 ENCOUNTER — Encounter (HOSPITAL_COMMUNITY): Payer: Self-pay

## 2013-08-24 ENCOUNTER — Inpatient Hospital Stay (HOSPITAL_COMMUNITY)
Admission: RE | Admit: 2013-08-24 | Discharge: 2013-08-28 | DRG: 766 | Disposition: A | Discharge status: Home / Self Care | Payer: 59 | Source: Ambulatory Visit | Attending: Obstetrics and Gynecology | Admitting: Obstetrics and Gynecology

## 2013-08-24 DIAGNOSIS — O99814 Abnormal glucose complicating childbirth: Principal | ICD-10-CM | POA: Diagnosis present

## 2013-08-24 LAB — CBC
HCT: 34.2 % — ABNORMAL LOW (ref 36.0–46.0)
MCH: 28.7 pg (ref 26.0–34.0)
MCV: 85.3 fL (ref 78.0–100.0)
Platelets: 181 10*3/uL (ref 150–400)
RBC: 4.01 MIL/uL (ref 3.87–5.11)
RDW: 14.7 % (ref 11.5–15.5)

## 2013-08-24 MED ORDER — LACTATED RINGERS IV SOLN
INTRAVENOUS | Status: DC
  Administered 2013-08-25: 125 mL/h via INTRAVENOUS
  Administered 2013-08-26 (×4): via INTRAVENOUS

## 2013-08-24 MED ORDER — ZOLPIDEM TARTRATE 5 MG PO TABS
5.0000 mg | ORAL_TABLET | Freq: Every evening | ORAL | Status: DC | PRN
  Administered 2013-08-26: 5 mg via ORAL
  Filled 2013-08-24: qty 1

## 2013-08-24 MED ORDER — IBUPROFEN 600 MG PO TABS: 600.0000 mg | ORAL_TABLET | Freq: Four times a day (QID) | ORAL | Status: DC | PRN

## 2013-08-24 MED ORDER — MISOPROSTOL 25 MCG QUARTER TABLET
25.0000 ug | ORAL_TABLET | ORAL | Status: DC | PRN
  Administered 2013-08-24 – 2013-08-26 (×5): 25 ug via VAGINAL
  Filled 2013-08-24 (×5): qty 0.25

## 2013-08-24 MED ORDER — OXYTOCIN 40 UNITS IN LACTATED RINGERS INFUSION - SIMPLE MED: 62.5000 mL/h | INTRAVENOUS | Status: DC

## 2013-08-24 MED ORDER — CITRIC ACID-SODIUM CITRATE 334-500 MG/5ML PO SOLN
30.0000 mL | ORAL | Status: DC | PRN
  Filled 2013-08-24 (×2): qty 15

## 2013-08-24 MED ORDER — TERBUTALINE SULFATE 1 MG/ML IJ SOLN: 0.2500 mg | Freq: Once | INTRAMUSCULAR | Status: AC | PRN

## 2013-08-24 MED ORDER — OXYTOCIN BOLUS FROM INFUSION: 500.0000 mL | INTRAVENOUS | Status: DC

## 2013-08-24 MED ORDER — FLEET ENEMA 7-19 GM/118ML RE ENEM: 1.0000 | ENEMA | RECTAL | Status: DC | PRN

## 2013-08-24 MED ORDER — LACTATED RINGERS IV SOLN
500.0000 mL | INTRAVENOUS | Status: DC | PRN
  Administered 2013-08-26: 500 mL via INTRAVENOUS

## 2013-08-24 MED ORDER — LIDOCAINE HCL (PF) 1 % IJ SOLN: 30.0000 mL | INTRAMUSCULAR | Status: DC | PRN

## 2013-08-24 MED ORDER — ACETAMINOPHEN 325 MG PO TABS
650.0000 mg | ORAL_TABLET | ORAL | Status: DC | PRN
  Administered 2013-08-26: 650 mg via ORAL
  Filled 2013-08-24: qty 2

## 2013-08-24 MED ORDER — ONDANSETRON HCL 4 MG/2ML IJ SOLN: 4.0000 mg | Freq: Four times a day (QID) | INTRAMUSCULAR | Status: DC | PRN

## 2013-08-24 MED ORDER — OXYCODONE-ACETAMINOPHEN 5-325 MG PO TABS: 1.0000 | ORAL_TABLET | ORAL | Status: DC | PRN

## 2013-08-24 NOTE — H&P (Signed)
Elizabeth [name], Elizabeth [name]          ACCOUNT NO.:  [id number]  MEDICAL RECORD NO.:  [id number]  LOCATION:                                 FACILITY:  PHYSICIAN:  Duke Salvia. Marcelle [name], Elizabeth [name].DATE OF BIRTH:  Mar 15, 1981  DATE OF ADMISSION:  08/24/2013 DATE OF DISCHARGE:                             HISTORY & PHYSICAL   CHIEF COMPLAINT:  For labor induction at term.  HPI:  A 32 year old, G1, P0, EDD is December 19.  The patient's pregnancy has been complicated by gestational diabetes and has required the use of glyburide with good CBG control.  She has had weekly reactive nonstress tests.  She presents now at her EDD for labor induction.  Last cervical exam was 1, 50%, -3.  GBS was negative.  PAST MEDICAL HISTORY:  Please see the [facility] form for details.  PHYSICAL EXAMINATION:  VITAL SIGNS:  Temp 98.2, blood pressure 110/60. HEENT:  Unremarkable. NECK:  Supple without masses. LUNGS:  Clear. CARDIOVASCULAR:  Regular rate and rhythm without murmurs, rubs, or gallops. BREASTS:  Not examined. PELVIC:  Term fundal height.  Fetal heart rate 140.  Cervix was 1, 50%, -3, membranes intact. EXTREMITIES:  Unremarkable. NEUROLOGIC:  Unremarkable.  IMPRESSION:  Term pregnancy, well-controlled gestational diabetes requiring glyburide.  PLAN:  Two-stage labor induction.  This procedure including specific risks related to possibility of a 2 day labor induction, increased cesarean risks for failed induction discussed with her, which she understands and accepts.     Elizabeth [name], Elizabeth [name].     RMH/MEDQ  D:  08/24/2013  T:  08/24/2013  Job:  [id number]

## 2013-08-25 ENCOUNTER — Inpatient Hospital Stay (HOSPITAL_COMMUNITY): Payer: 59

## 2013-08-25 LAB — TYPE AND SCREEN: Antibody Screen: NEGATIVE

## 2013-08-25 MED ORDER — TERBUTALINE SULFATE 1 MG/ML IJ SOLN: 0.2500 mg | Freq: Once | INTRAMUSCULAR | Status: AC | PRN

## 2013-08-25 MED ORDER — OXYTOCIN 40 UNITS IN LACTATED RINGERS INFUSION - SIMPLE MED
1.0000 m[IU]/min | INTRAVENOUS | Status: DC
  Administered 2013-08-25: 2 m[IU]/min via INTRAVENOUS
  Filled 2013-08-25 (×2): qty 1000

## 2013-08-25 NOTE — H&P (Signed)
Elizabeth [name]  DICTATION # [id number] CSN# [id number]   Meriel Pica, MD 08/25/2013 8:38 AM

## 2013-08-25 NOTE — Progress Notes (Signed)
Received Cytotec X 2 last evening, pitocin today w/ attempted AROM>>>vtx too high.  On pitocin today w/o any signif change, still 2+/50/post/-2, stable FHR after pit turned off recently.  Offered either rest + repeat Cytotec for second day induction or CS.  Pt _ husband agree that their preference is rest, eat>>restart Cytotec this evening

## 2013-08-25 NOTE — Progress Notes (Signed)
Oxygen removed

## 2013-08-25 NOTE — Progress Notes (Signed)
Applying fetal heart monitors

## 2013-08-25 NOTE — Progress Notes (Signed)
[id number] RN worked with [employer] to work.  Monitor changed out once and two beacons set up.  FHR traced intermittently with external u/s

## 2013-08-25 NOTE — Progress Notes (Signed)
Beacon discontinued, not tracing consistently

## 2013-08-25 NOTE — Progress Notes (Signed)
MD attempted to place IFSE to AROM No fluid return

## 2013-08-25 NOTE — Progress Notes (Signed)
Oxygen off

## 2013-08-26 ENCOUNTER — Encounter (HOSPITAL_COMMUNITY): Payer: Self-pay

## 2013-08-26 ENCOUNTER — Inpatient Hospital Stay (HOSPITAL_COMMUNITY): Payer: 59 | Admitting: Anesthesiology

## 2013-08-26 ENCOUNTER — Encounter (HOSPITAL_COMMUNITY): Payer: 59 | Admitting: Anesthesiology

## 2013-08-26 ENCOUNTER — Encounter (HOSPITAL_COMMUNITY)
Admission: RE | Disposition: A | Discharge status: Home / Self Care | Payer: Self-pay | Source: Ambulatory Visit | Attending: Obstetrics and Gynecology

## 2013-08-26 LAB — CBC
Hemoglobin: 13.2 g/dL (ref 12.0–15.0)
MCHC: 34.4 g/dL (ref 30.0–36.0)
MCV: 85.5 fL (ref 78.0–100.0)
Platelets: 180 10*3/uL (ref 150–400)
RBC: 4.49 MIL/uL (ref 3.87–5.11)

## 2013-08-26 SURGERY — Surgical Case
Anesthesia: Epidural | Site: Abdomen

## 2013-08-26 MED ORDER — LACTATED RINGERS IV SOLN
500.0000 mL | Freq: Once | INTRAVENOUS | Status: AC
  Administered 2013-08-26: 500 mL via INTRAVENOUS

## 2013-08-26 MED ORDER — CEFAZOLIN SODIUM-DEXTROSE 2-3 GM-% IV SOLR
INTRAVENOUS | Status: DC | PRN
  Administered 2013-08-26: 2 g via INTRAVENOUS

## 2013-08-26 MED ORDER — KETOROLAC TROMETHAMINE 30 MG/ML IJ SOLN: 30.0000 mg | Freq: Four times a day (QID) | INTRAMUSCULAR | Status: AC | PRN

## 2013-08-26 MED ORDER — LANOLIN HYDROUS EX OINT: 1.0000 "application " | TOPICAL_OINTMENT | CUTANEOUS | Status: DC | PRN

## 2013-08-26 MED ORDER — EPHEDRINE 5 MG/ML INJ
10.0000 mg | INTRAVENOUS | Status: DC | PRN
  Filled 2013-08-26: qty 4

## 2013-08-26 MED ORDER — ONDANSETRON HCL 4 MG/2ML IJ SOLN
INTRAMUSCULAR | Status: DC | PRN
  Administered 2013-08-26: 4 mg via INTRAVENOUS

## 2013-08-26 MED ORDER — DIBUCAINE 1 % RE OINT: 1.0000 "application " | TOPICAL_OINTMENT | RECTAL | Status: DC | PRN

## 2013-08-26 MED ORDER — SIMETHICONE 80 MG PO CHEW
80.0000 mg | CHEWABLE_TABLET | Freq: Three times a day (TID) | ORAL | Status: DC
  Administered 2013-08-26 – 2013-08-28 (×6): 80 mg via ORAL
  Filled 2013-08-26 (×5): qty 1

## 2013-08-26 MED ORDER — FENTANYL 2.5 MCG/ML BUPIVACAINE 1/10 % EPIDURAL INFUSION (WH - ANES)
INTRAMUSCULAR | Status: DC | PRN
  Administered 2013-08-26: 14 mL/h via EPIDURAL

## 2013-08-26 MED ORDER — OXYCODONE-ACETAMINOPHEN 5-325 MG PO TABS
1.0000 | ORAL_TABLET | Freq: Once | ORAL | Status: AC
  Administered 2013-08-26: 1 via ORAL
  Filled 2013-08-26: qty 1

## 2013-08-26 MED ORDER — SODIUM CHLORIDE 0.9 % IJ SOLN: 3.0000 mL | INTRAMUSCULAR | Status: DC | PRN

## 2013-08-26 MED ORDER — ONDANSETRON HCL 4 MG PO TABS: 4.0000 mg | ORAL_TABLET | ORAL | Status: DC | PRN

## 2013-08-26 MED ORDER — HYDROMORPHONE HCL PF 1 MG/ML IJ SOLN
INTRAMUSCULAR | Status: AC
  Administered 2013-08-26: 0.5 mg via INTRAVENOUS
  Filled 2013-08-26: qty 1

## 2013-08-26 MED ORDER — CEFAZOLIN SODIUM 1-5 GM-% IV SOLN
1.0000 g | Freq: Three times a day (TID) | INTRAVENOUS | Status: DC
  Filled 2013-08-26 (×2): qty 50

## 2013-08-26 MED ORDER — SENNOSIDES-DOCUSATE SODIUM 8.6-50 MG PO TABS
2.0000 | ORAL_TABLET | ORAL | Status: DC
  Administered 2013-08-26 – 2013-08-28 (×2): 2 via ORAL
  Filled 2013-08-26 (×2): qty 2

## 2013-08-26 MED ORDER — NALBUPHINE HCL 10 MG/ML IJ SOLN
5.0000 mg | INTRAMUSCULAR | Status: DC | PRN
  Filled 2013-08-26 (×2): qty 1

## 2013-08-26 MED ORDER — EPHEDRINE 5 MG/ML INJ
10.0000 mg | INTRAVENOUS | Status: AC | PRN
  Administered 2013-08-26 (×2): 10 mg via INTRAVENOUS

## 2013-08-26 MED ORDER — ZOLPIDEM TARTRATE 5 MG PO TABS: 5.0000 mg | ORAL_TABLET | Freq: Every evening | ORAL | Status: DC | PRN

## 2013-08-26 MED ORDER — OXYCODONE-ACETAMINOPHEN 5-325 MG PO TABS
1.0000 | ORAL_TABLET | Freq: Four times a day (QID) | ORAL | Status: DC | PRN
Start: 2013-08-26 — End: 2013-08-28
  Administered 2013-08-27: 2 via ORAL
  Administered 2013-08-27 (×2): 1 via ORAL
  Administered 2013-08-27 – 2013-08-28 (×4): 2 via ORAL
  Filled 2013-08-26 (×5): qty 2
  Filled 2013-08-26 (×2): qty 1
  Filled 2013-08-26: qty 2

## 2013-08-26 MED ORDER — OXYTOCIN 10 UNIT/ML IJ SOLN
INTRAMUSCULAR | Status: AC
  Filled 2013-08-26: qty 4

## 2013-08-26 MED ORDER — NALBUPHINE HCL 10 MG/ML IJ SOLN
5.0000 mg | INTRAMUSCULAR | Status: DC | PRN
  Administered 2013-08-26 – 2013-08-27 (×3): 5 mg via SUBCUTANEOUS
  Filled 2013-08-26 (×3): qty 1

## 2013-08-26 MED ORDER — MEASLES, MUMPS & RUBELLA VAC ~~LOC~~ INJ
0.5000 mL | INJECTION | Freq: Once | SUBCUTANEOUS | Status: DC
  Filled 2013-08-26: qty 0.5

## 2013-08-26 MED ORDER — MORPHINE SULFATE 0.5 MG/ML IJ SOLN
INTRAMUSCULAR | Status: AC
  Filled 2013-08-26: qty 10

## 2013-08-26 MED ORDER — DIPHENHYDRAMINE HCL 25 MG PO CAPS: 25.0000 mg | ORAL_CAPSULE | Freq: Four times a day (QID) | ORAL | Status: DC | PRN

## 2013-08-26 MED ORDER — METOCLOPRAMIDE HCL 5 MG/ML IJ SOLN: 10.0000 mg | Freq: Three times a day (TID) | INTRAMUSCULAR | Status: DC | PRN

## 2013-08-26 MED ORDER — SCOPOLAMINE 1 MG/3DAYS TD PT72
MEDICATED_PATCH | TRANSDERMAL | Status: AC
  Filled 2013-08-26: qty 1

## 2013-08-26 MED ORDER — HYDROMORPHONE HCL PF 1 MG/ML IJ SOLN
0.2500 mg | INTRAMUSCULAR | Status: DC | PRN
  Administered 2013-08-26 (×2): 0.5 mg via INTRAVENOUS

## 2013-08-26 MED ORDER — SODIUM CHLORIDE 0.9 % IJ SOLN: 3.0000 mL | Freq: Two times a day (BID) | INTRAMUSCULAR | Status: DC

## 2013-08-26 MED ORDER — IBUPROFEN 800 MG PO TABS
800.0000 mg | ORAL_TABLET | Freq: Three times a day (TID) | ORAL | Status: DC | PRN
  Administered 2013-08-26 – 2013-08-28 (×4): 800 mg via ORAL
  Filled 2013-08-26 (×4): qty 1

## 2013-08-26 MED ORDER — SODIUM CHLORIDE 0.9 % IV SOLN: 250.0000 mL | INTRAVENOUS | Status: DC

## 2013-08-26 MED ORDER — SIMETHICONE 80 MG PO CHEW
80.0000 mg | CHEWABLE_TABLET | ORAL | Status: DC
  Administered 2013-08-26: 80 mg via ORAL
  Filled 2013-08-26 (×2): qty 1

## 2013-08-26 MED ORDER — WITCH HAZEL-GLYCERIN EX PADS: 1.0000 "application " | MEDICATED_PAD | CUTANEOUS | Status: DC | PRN

## 2013-08-26 MED ORDER — ONDANSETRON HCL 4 MG/2ML IJ SOLN: 4.0000 mg | Freq: Three times a day (TID) | INTRAMUSCULAR | Status: DC | PRN

## 2013-08-26 MED ORDER — PRENATAL MULTIVITAMIN CH
1.0000 | ORAL_TABLET | Freq: Every day | ORAL | Status: DC
  Administered 2013-08-27: 1 via ORAL
  Filled 2013-08-26: qty 1

## 2013-08-26 MED ORDER — DIPHENHYDRAMINE HCL 25 MG PO CAPS: 25.0000 mg | ORAL_CAPSULE | ORAL | Status: DC | PRN

## 2013-08-26 MED ORDER — NALOXONE HCL 0.4 MG/ML IJ SOLN: 0.4000 mg | INTRAMUSCULAR | Status: DC | PRN

## 2013-08-26 MED ORDER — OXYTOCIN 10 UNIT/ML IJ SOLN
40.0000 [IU] | INTRAVENOUS | Status: DC | PRN
  Administered 2013-08-26: 40 [IU] via INTRAVENOUS

## 2013-08-26 MED ORDER — SIMETHICONE 80 MG PO CHEW: 80.0000 mg | CHEWABLE_TABLET | ORAL | Status: DC | PRN

## 2013-08-26 MED ORDER — SCOPOLAMINE 1 MG/3DAYS TD PT72: 1.0000 | MEDICATED_PATCH | Freq: Once | TRANSDERMAL | Status: DC

## 2013-08-26 MED ORDER — ONDANSETRON HCL 4 MG/2ML IJ SOLN
INTRAMUSCULAR | Status: AC
  Filled 2013-08-26: qty 2

## 2013-08-26 MED ORDER — LIDOCAINE HCL (PF) 1 % IJ SOLN
INTRAMUSCULAR | Status: DC | PRN
  Administered 2013-08-26 (×2): 5 mL
  Administered 2013-08-26 (×2): 4 mL
  Administered 2013-08-26: 5 mL

## 2013-08-26 MED ORDER — DIPHENHYDRAMINE HCL 50 MG/ML IJ SOLN: 12.5000 mg | INTRAMUSCULAR | Status: DC | PRN

## 2013-08-26 MED ORDER — NALOXONE HCL 1 MG/ML IJ SOLN
1.0000 ug/kg/h | INTRAVENOUS | Status: DC | PRN
  Filled 2013-08-26: qty 2

## 2013-08-26 MED ORDER — PNEUMOCOCCAL VAC POLYVALENT 25 MCG/0.5ML IJ INJ
0.5000 mL | INJECTION | INTRAMUSCULAR | Status: AC
  Administered 2013-08-27: 0.5 mL via INTRAMUSCULAR
  Filled 2013-08-26: qty 0.5

## 2013-08-26 MED ORDER — OXYTOCIN 40 UNITS IN LACTATED RINGERS INFUSION - SIMPLE MED: 62.5000 mL/h | INTRAVENOUS | Status: AC

## 2013-08-26 MED ORDER — MEPERIDINE HCL 25 MG/ML IJ SOLN: 6.2500 mg | INTRAMUSCULAR | Status: DC | PRN

## 2013-08-26 MED ORDER — PHENYLEPHRINE 40 MCG/ML (10ML) SYRINGE FOR IV PUSH (FOR BLOOD PRESSURE SUPPORT)
80.0000 ug | PREFILLED_SYRINGE | INTRAVENOUS | Status: DC | PRN
  Filled 2013-08-26: qty 10

## 2013-08-26 MED ORDER — ONDANSETRON HCL 4 MG/2ML IJ SOLN: 4.0000 mg | INTRAMUSCULAR | Status: DC | PRN

## 2013-08-26 MED ORDER — SODIUM BICARBONATE 8.4 % IV SOLN
INTRAVENOUS | Status: AC
  Filled 2013-08-26: qty 50

## 2013-08-26 MED ORDER — DIPHENHYDRAMINE HCL 50 MG/ML IJ SOLN: 25.0000 mg | INTRAMUSCULAR | Status: DC | PRN

## 2013-08-26 MED ORDER — MORPHINE SULFATE (PF) 0.5 MG/ML IJ SOLN
INTRAMUSCULAR | Status: DC | PRN
  Administered 2013-08-26: 1 mg via INTRAVENOUS
  Administered 2013-08-26: 4 mg via EPIDURAL

## 2013-08-26 MED ORDER — KETOROLAC TROMETHAMINE 60 MG/2ML IM SOLN
INTRAMUSCULAR | Status: AC
  Filled 2013-08-26: qty 2

## 2013-08-26 MED ORDER — PHENYLEPHRINE 40 MCG/ML (10ML) SYRINGE FOR IV PUSH (FOR BLOOD PRESSURE SUPPORT): 80.0000 ug | PREFILLED_SYRINGE | INTRAVENOUS | Status: DC | PRN

## 2013-08-26 MED ORDER — FLEET ENEMA 7-19 GM/118ML RE ENEM: 1.0000 | ENEMA | Freq: Every day | RECTAL | Status: DC | PRN

## 2013-08-26 MED ORDER — TETANUS-DIPHTH-ACELL PERTUSSIS 5-2.5-18.5 LF-MCG/0.5 IM SUSP: 0.5000 mL | Freq: Once | INTRAMUSCULAR | Status: DC

## 2013-08-26 MED ORDER — FENTANYL 2.5 MCG/ML BUPIVACAINE 1/10 % EPIDURAL INFUSION (WH - ANES)
14.0000 mL/h | INTRAMUSCULAR | Status: DC | PRN
  Filled 2013-08-26: qty 125

## 2013-08-26 MED ORDER — KETOROLAC TROMETHAMINE 60 MG/2ML IM SOLN
60.0000 mg | Freq: Once | INTRAMUSCULAR | Status: AC | PRN
  Administered 2013-08-26: 60 mg via INTRAMUSCULAR

## 2013-08-26 MED ORDER — TERBUTALINE SULFATE 1 MG/ML IJ SOLN: 0.2500 mg | Freq: Once | INTRAMUSCULAR | Status: DC | PRN

## 2013-08-26 MED ORDER — LIDOCAINE-EPINEPHRINE (PF) 2 %-1:200000 IJ SOLN
INTRAMUSCULAR | Status: AC
  Filled 2013-08-26: qty 20

## 2013-08-26 MED ORDER — MENTHOL 3 MG MT LOZG: 1.0000 | LOZENGE | OROMUCOSAL | Status: DC | PRN

## 2013-08-26 MED ORDER — OXYTOCIN 40 UNITS IN LACTATED RINGERS INFUSION - SIMPLE MED
1.0000 m[IU]/min | INTRAVENOUS | Status: DC
  Administered 2013-08-26: 2 m[IU]/min via INTRAVENOUS

## 2013-08-26 MED ORDER — BISACODYL 10 MG RE SUPP: 10.0000 mg | Freq: Every day | RECTAL | Status: DC | PRN

## 2013-08-26 MED ORDER — PROMETHAZINE HCL 25 MG/ML IJ SOLN: 6.2500 mg | INTRAMUSCULAR | Status: DC | PRN

## 2013-08-26 SURGICAL SUPPLY — 27 items
BENZOIN TINCTURE PRP APPL 2/3 (GAUZE/BANDAGES/DRESSINGS) ×2 IMPLANT
CLAMP CORD UMBIL (MISCELLANEOUS) IMPLANT
CLOTH BEACON ORANGE TIMEOUT ST (SAFETY) ×2 IMPLANT
DRAPE LG THREE QUARTER DISP (DRAPES) IMPLANT
DRSG OPSITE POSTOP 4X10 (GAUZE/BANDAGES/DRESSINGS) ×2 IMPLANT
DURAPREP 26ML APPLICATOR (WOUND CARE) ×2 IMPLANT
ELECT REM PT RETURN 9FT ADLT (ELECTROSURGICAL) ×2
ELECTRODE REM PT RTRN 9FT ADLT (ELECTROSURGICAL) ×1 IMPLANT
EXTRACTOR VACUUM M CUP 4 TUBE (SUCTIONS) IMPLANT
GLOVE BIO SURGEON STRL SZ7 (GLOVE) ×2 IMPLANT
GOWN PREVENTION PLUS XLARGE (GOWN DISPOSABLE) ×4 IMPLANT
GOWN STRL REIN XL XLG (GOWN DISPOSABLE) ×4 IMPLANT
KIT ABG SYR 3ML LUER SLIP (SYRINGE) ×2 IMPLANT
NEEDLE HYPO 25X5/8 SAFETYGLIDE (NEEDLE) ×2 IMPLANT
NS IRRIG 1000ML POUR BTL (IV SOLUTION) ×2 IMPLANT
PACK C SECTION WH (CUSTOM PROCEDURE TRAY) ×2 IMPLANT
PAD OB MATERNITY 4.3X12.25 (PERSONAL CARE ITEMS) ×2 IMPLANT
STRIP CLOSURE SKIN 1/2X4 (GAUZE/BANDAGES/DRESSINGS) ×2 IMPLANT
STRIP CLOSURE SKIN 1/4X4 (GAUZE/BANDAGES/DRESSINGS) ×2 IMPLANT
SUT CHROMIC 0 CTX 36 (SUTURE) ×6 IMPLANT
SUT MON AB 4-0 PS1 27 (SUTURE) ×2 IMPLANT
SUT PDS AB 0 CT1 27 (SUTURE) ×4 IMPLANT
SUT VIC AB 3-0 CT1 27 (SUTURE) ×2
SUT VIC AB 3-0 CT1 TAPERPNT 27 (SUTURE) ×2 IMPLANT
TOWEL OR 17X24 6PK STRL BLUE (TOWEL DISPOSABLE) ×2 IMPLANT
TRAY FOLEY CATH 14FR (SET/KITS/TRAYS/PACK) ×2 IMPLANT
WATER STERILE IRR 1000ML POUR (IV SOLUTION) ×2 IMPLANT

## 2013-08-26 NOTE — Transfer of Care (Signed)
Immediate Anesthesia Transfer of Care Note  Patient: Elizabeth [name]  Procedure(s) Performed: Procedure(s): CESAREAN SECTION (N/A)  Patient Location: PACU  Anesthesia Type:Epidural  Level of Consciousness: awake and alert   Airway & Oxygen Therapy: Patient Spontanous Breathing  Post-op Assessment: Report given to PACU RN and Post -op Vital signs reviewed and stable  Post vital signs: stable  Complications: No apparent anesthesia complications

## 2013-08-26 NOTE — Op Note (Signed)
Preoperative diagnosis: Fetal intolerance to labor  Postoperative diagnosis: Same plus OP presentation, several loops the cord up around the neck area although not nuchal  Procedure: Primary low transverse cesarean section  Surgeon: Marcelle Overlie  Anesthesia: Epidural  EBL: 700 cc  Drains: Foley catheter  Procedure and findings:  Patient taken the operating room after an adequate level of epidural anesthesia was obtained with the patient in left tilt position she was prepped and draped in the usual fashion. Appropriate timeout for taken at that point. Transverse incision made 2 finger restaurant symphysis carried down to the fascia which was incised and extended transversely, rectus muscle divided in the midline, peritoneum entered superiorly without incident and extended in a vertical fashion. The vesicouterine serosa was incised and the bladder was bluntly and sharply dissected below, bladder blade repositioned. Transverse incision made in the lower segment extended with bandage scissors clear fluid noted the patient then delivered of a healthy female Apgars 9 and 9 there were several loops a cord around the neck area although not nuchal, also noted to be in straight OP presentation. The infant was suctioned cord clamped and passed the pediatric team for further care. Cord blood specimen pH were sent, placenta was then delivered manually intact, uterus exteriorized cavity wiped clean with a laparotomy pack closure obtained the first layer of 0 chromic in a locked fashion followed by an imbricating layer of 0 chromic. This is hemostatic the bladder flap area was intact and hemostatic. Bilateral tubes and ovaries were normal prior to closure spongeneedle and instrument count reported  as correct x2. Peritoneum was enclose a running 3-0 Vicryl suture, 3-0 Vicryl interrupted sutures used to reapproximate the rectus muscles in the midline. 0 PDS was then used to close the fascia from laterally to midline on  either side. Subcutaneous tissue was irrigated noted be hemostatic and was fairly thin not closed separately. 4-0 Monocryl subcuticular closure on the skin. Clear urine noted in the case she tolerated this well went to recovery room in good condition.  Dictated with [facility]  Detron Carras M. Milana Obey.D.

## 2013-08-26 NOTE — Anesthesia Procedure Notes (Signed)
Epidural Patient location during procedure: OB Start time: 08/26/2013 10:45 AM End time: 08/26/2013 10:55 AM  Staffing Anesthesiologist: Lewie Loron R  Preanesthetic Checklist Completed: patient identified, pre-op evaluation, timeout performed, IV checked, risks and benefits discussed and monitors and equipment checked  Epidural Patient position: sitting Prep: site prepped and draped and DuraPrep Patient monitoring: blood pressure, continuous pulse ox and heart rate Approach: midline Injection technique: LOR saline and LOR air  Needle:  Needle type: Tuohy  Needle gauge: 17 G Needle length: 9 cm Needle insertion depth: 6 cm Catheter type: closed end flexible Catheter size: 19 Gauge Catheter at skin depth: 12 cm Test dose: negative  Assessment Sensory level: T8 Events: blood not aspirated, injection not painful, no injection resistance, negative IV test and no paresthesia  Additional Notes Reason for block:procedure for pain

## 2013-08-26 NOTE — Progress Notes (Signed)
Now2-3/50/-2>>>ISE for AROM>>>clr AF, will start pit aug per protocol;

## 2013-08-26 NOTE — Anesthesia Preprocedure Evaluation (Signed)
Anesthesia Evaluation  Patient identified by MRN, date of birth, ID band Patient awake    Reviewed: Allergy & Precautions, H&P , NPO status , Patient's Chart, lab work & pertinent test results  Airway Mallampati: II TM Distance: >3 FB Neck ROM: Full    Dental  (+) Dental Advisory Given   Pulmonary asthma ,  breath sounds clear to auscultation        Cardiovascular negative cardio ROS  + Valvular Problems/Murmurs Rhythm:Regular     Neuro/Psych  Headaches, PSYCHIATRIC DISORDERS Depression negative psych ROS   GI/Hepatic negative GI ROS, Neg liver ROS,   Endo/Other  diabetes, Type 2, Oral Hypoglycemic Agents  Renal/GU negative Renal ROS     Musculoskeletal negative musculoskeletal ROS (+)   Abdominal   Peds  Hematology negative hematology ROS (+)   Anesthesia Other Findings   Reproductive/Obstetrics (+) Pregnancy                           Anesthesia Physical Anesthesia Plan  ASA: II  Anesthesia Plan: Epidural   Post-op Pain Management:    Induction:   Airway Management Planned:   Additional Equipment:   Intra-op Plan:   Post-operative Plan:   Informed Consent: I have reviewed the patients History and Physical, chart, labs and discussed the procedure including the risks, benefits and alternatives for the proposed anesthesia with the patient or authorized representative who has indicated his/her understanding and acceptance.     Plan Discussed with:   Anesthesia Plan Comments:         Anesthesia Quick Evaluation

## 2013-08-26 NOTE — Progress Notes (Signed)
Developed decels after epidural>>>pt off, with recovery.  Comfortable now @ 4/75/vtx, but after IUPC + ISE placed>>>repetitive late decels with each ctx , recovers in between>>>rec CS for fetal intol to labor, proced + risks discussed

## 2013-08-27 ENCOUNTER — Encounter (HOSPITAL_COMMUNITY): Payer: Self-pay | Admitting: Obstetrics and Gynecology

## 2013-08-27 LAB — CBC
Hemoglobin: 10.6 g/dL — ABNORMAL LOW (ref 12.0–15.0)
MCH: 29.2 pg (ref 26.0–34.0)
MCHC: 33.9 g/dL (ref 30.0–36.0)
MCV: 86.2 fL (ref 78.0–100.0)
Platelets: 165 10*3/uL (ref 150–400)

## 2013-08-27 NOTE — Lactation Note (Signed)
This note was copied from the chart of Elizabeth Tyreisha Ungar. Lactation Consultation Note Mom reports baby is very fussy and not latching well after several good feeding during the day.  Mom is now hurting with pink nipples and small bruises.  Mom has DEBP, but is not currently using it.  Hand expression with about 2 mls spoon fed to baby as "appetizer." Baby was asleep at the chest skin to skin and when moved to awaken baby became very frantic.  Sucking on gloved finger soothed baby.  Assisted to football hold on right breast with #20 nipple shield.  Baby remained in good rhythmic suckling for 15 minutes, with minimal stimulation.  Nipple shield had colostrum in it and nipple was round.  Baby moved to left breast in football hold with several repositioning attempts to decrease pain to be tolerable to feed baby.  Mom reports pain of about 4 out of 10.  Mom to express colostrum to rub into nipples and prior to feeding to offer to baby to calm baby if he is frantic.  Encouraged skin to skin and very frequent feeding during the night.  Mom to follow feeding cues or wake baby if baby is sleepy. Baby has been voiding and stoolling appropriately.  Report given to [facility] RN and mom to call for assist as needed.   Patient Name: Elizabeth [name] [id number] Date: 08/27/2013 Reason for consult: Follow-up assessment;Breast/nipple pain;Difficult latch;Infant < 6lbs   Maternal Data    Feeding Feeding Type: Breast Fed Length of feed: 1 min (observed 15 minutes)  LATCH Score/Interventions Latch: Repeated attempts needed to sustain latch, nipple held in mouth throughout feeding, stimulation needed to elicit sucking reflex. Intervention(s): Skin to skin;Teach feeding cues;Waking techniques Intervention(s): Adjust position;Assist with latch;Breast massage;Breast compression  Audible Swallowing: A few with stimulation Intervention(s): Skin to skin;Hand expression  Type of Nipple: Everted at rest and after  stimulation (semi erect)  Comfort (Breast/Nipple): Filling, red/small blisters or bruises, mild/mod discomfort  Problem noted: Cracked, bleeding, blisters, bruises Interventions  (Cracked/bleeding/bruising/blister): Expressed breast milk to nipple  Hold (Positioning): Assistance needed to correctly position infant at breast and maintain latch. Intervention(s): Breastfeeding basics reviewed;Support Pillows;Skin to skin  LATCH Score: 6  Lactation Tools Discussed/Used Tools: Nipple Shields Nipple shield size: 20;24   Consult Status Date: 08/28/13 Follow-up type: In-patient    Elizabeth [name], Arvella Merles 08/27/2013, 11:59 PM

## 2013-08-27 NOTE — Anesthesia Postprocedure Evaluation (Signed)
  Anesthesia Post-op Note  Patient: Elizabeth [name]  Procedure(s) Performed: Procedure(s): CESAREAN SECTION (N/A)  Patient Location: Mother/Baby  Anesthesia Type:Epidural  Level of Consciousness: awake, alert  and oriented  Airway and Oxygen Therapy: Patient Spontanous Breathing  Post-op Pain: none  Post-op Assessment: Post-op Vital signs reviewed, Patient's Cardiovascular Status Stable, Patent Airway, No headache, No backache, No residual numbness and No residual motor weakness  Post-op Vital Signs: Reviewed and stable  Complications: No apparent anesthesia complications

## 2013-08-27 NOTE — Progress Notes (Signed)
Subjective: Postpartum Day 1: Cesarean Delivery Patient reports tolerating PO.    Objective: Vital signs in last 24 hours: Temp:  [97.4 F (36.3 C)-98.9 F (37.2 C)] 98.7 F (37.1 C) (12/22 0630) Pulse Rate:  [71-107] 83 (12/22 0630) Resp:  [16-24] 18 (12/22 0630) BP: (85-119)/(31-83) 111/59 mmHg (12/22 0630) SpO2:  [97 %-100 %] 97 % (12/22 0630)  Physical Exam:  General: alert and cooperative Lochia: appropriate Uterine Fundus: firm Incision: honeycomb dressing CDI DVT Evaluation: No evidence of DVT seen on physical exam. Negative Homan's sign. No cords or calf tenderness. No significant calf/ankle edema.   Recent Labs  08/26/13 1033 08/27/13 0555  HGB 13.2 10.6*  HCT 38.4 31.3*    Assessment/Plan: Status post Cesarean section. Doing well postoperatively.  Continue current care. Baby circ after discharge, Jewish ethnicity  CURTIS,CAROL G 08/27/2013, 8:09 AM

## 2013-08-27 NOTE — Anesthesia Postprocedure Evaluation (Signed)
Anesthesia Post Note  Patient: Elizabeth [name]  Procedure(s) Performed: Procedure(s) (LRB): CESAREAN SECTION (N/A)  Anesthesia type: Epidural  Patient location: PACU  Post pain: Pain level controlled  Post assessment: Post-op Vital signs reviewed  Last Vitals: BP 107/59  Pulse 81  Temp(Src) 36.9 C (Oral)  Resp 20  Ht 5\' 2"  (1.575 m)  Wt 194 lb (87.998 kg)  BMI 35.47 kg/m2  SpO2 98%  Post vital signs: Reviewed  Level of consciousness: sedated  Complications: No apparent anesthesia complications

## 2013-08-28 MED ORDER — OXYCODONE-ACETAMINOPHEN 5-325 MG PO TABS: 1.0000 | ORAL_TABLET | ORAL | Status: DC | PRN

## 2013-08-28 MED ORDER — IBUPROFEN 800 MG PO TABS: 800.0000 mg | ORAL_TABLET | Freq: Three times a day (TID) | ORAL | Status: DC | PRN

## 2013-08-28 NOTE — Discharge Summary (Signed)
Obstetric Discharge Summary Reason for Admission: induction of labor Prenatal Procedures: ultrasound Intrapartum Procedures: cesarean: low cervical, transverse Postpartum Procedures: none Complications-Operative and Postpartum: none Hemoglobin  Date Value Range Status  08/27/2013 10.6* 12.0 - 15.0 g/dL Final     DELTA CHECK NOTED     REPEATED TO VERIFY     HCT  Date Value Range Status  08/27/2013 31.3* 36.0 - 46.0 % Final    Physical Exam:  General: alert and cooperative Lochia: appropriate Uterine Fundus: firm Incision: honeycomb dressing CDI DVT Evaluation: No evidence of DVT seen on physical exam. Negative Homan's sign. No cords or calf tenderness.  Discharge Diagnoses: Term Pregnancy-delivered  Discharge Information: Date: 08/28/2013 Activity: pelvic rest Diet: routine Medications: PNV, Ibuprofen, Colace and Percocet Condition: stable Instructions: refer to practice specific booklet Discharge to: home   Newborn Data: Live born female  Birth Weight: 7 lb 7.9 oz (3400 g) APGAR: 9, 10  Home with mother.  Bobbijo Holst G 08/28/2013, 8:39 AM

## 2013-09-04 ENCOUNTER — Ambulatory Visit: Payer: Self-pay

## 2013-09-04 NOTE — Lactation Note (Addendum)
This note was copied from the chart of Elizabeth [name]. Infant Lactation Consultation Outpatient Visit Note  Patient Name: Elizabeth [name] Date of Birth: 08/26/2011 Birth Weight:  7 lb 7.9 oz (3400 g) Gestational Age at Delivery: Gestational Age: [redacted]w[redacted]d Type of Delivery: C/Section -  BW- 7-7 oz  D/C weight - 6-4 oz  1st Dr. Visit- 6-1 oz  Milk came in 12/24  12/26 - 7-3 oz  Today's weight - 7-6.3 oz 3352 g  Reason for visit today - F/U due to use nipple shield - per mom stopped using the nipple shield  As soon as we left the hospital. Baby really didn't like it. Once milk came in "[place]" took off and started  Feeding well both breast and gaining. I have a very quick let down which causes him to be spitty at times.Last  night he was really cluster feeding.   Breastfeeding History- milk came in 12/24 ,  Frequency of Breastfeeding: 9x' s a day every 2 1/2 - 3 hours  Length of Feeding: 14 mins average ,  Voids: > 6  Stools: > 6 yellowish stools   Supplementing / Method: per mom no supplementing  Pumping:  Type of Pump: DEBP - Medela    Frequency: early days when milk came in , used the DEBP for engorgement treatment. ( lasted 3 days ok now )   Volume:  ( per mom not sure )   Comments:useing the #21 left , #24 on the right .   Consultation Evaluation: Both breast are full ( no engorgement noted ), right areola noted to have a bruise at the 10 o'clock position  And the 4 o'clock and bruising at the top of the nipple , areola compress able and milk easily expressed from both breast. Left nipple no bruising Nipple pinky red , no breakdown noted . No plug ducts noted .  Last feeding 136 pm for 12 mins  Initial Feeding Assessment: Pre-feed Weight: 7-6.3 oz 3352 g  Post-feed Weight: 7-9.3 oz 3438 g  Amount Transferred: 86 ml  Comments: Left breast , football position, latched well , assisted mom to obtain depth at the breast. Baby fed for 9 mins in a consistent pattern ,  multiply swallows , increased with breast compressions. Baby released on his own, nipple appears normal when baby released. No spittying .   Additional Feeding Assessment: wet diaper , changed , re-weight , changed diaper and baby acted hungry, rooting  Pre-feed Weight:7-7.9 oz 3398 g  Post-feed Weight: 7.7.9 oz 3394g  Amount Transferred: 4 ml  Comments: re-latched left breast , football, position, mom independent with latch , LC checked for depth, Per mom comfort able, latched for 6 mins , released , and spit moderate am't to large am't of breast milk.    Comments: For 63 days old "Huckleberry " is latching and feeding well . Mom has plenty milk , and will need to hand express off fullness.                       Mom aware of plan , to help decrease his spitting.   Total Breast milk Transferred this Visit: 90 ml  Total Supplement Given:  None   Lactation Plan of Care - Praised mom for her breast feeding efforts                                       -  Mom - encouraged rest , naps, plenty fluids, esp. Water and nutritious snacks and meals                                       - Feedings - every 2 1/2 -3 hours and with feeding cues                                       - Prior to latch , hand express off fullness 15 -30 ml , so Huckleberry won't be flooded with the 1st letdown                                       - Also so he will get to the fatty milk quicker                                       - Save milk and freeze it .                                      - Always soften 1st breast well prior to switch to 2nd breast - if Huckleberry doesn't feed 2nd breast release down ,                                       - Comfort gels for sore nipples ( package given with instructions )                                     Follow-Up- Per Dr. Victorino Dike Summer 09/10/2013 for 2 week apt.                   - LC recommended attending the [facility] on Monday's 7pm or Tuesday 's at 11am       Kathrin Greathouse 09/04/2013, 4:15 PM

## 2013-09-19 ENCOUNTER — Ambulatory Visit (HOSPITAL_COMMUNITY)
Admission: RE | Admit: 2013-09-19 | Discharge: 2013-09-19 | Disposition: A | Discharge status: Home / Self Care | Payer: 59 | Source: Ambulatory Visit | Attending: Obstetrics and Gynecology | Admitting: Obstetrics and Gynecology

## 2013-09-19 NOTE — Lactation Note (Addendum)
Adult Lactation Consultation Outpatient Visit Note  Patient Name: Elizabeth [name] Date of Birth: 16-Feb-1981 Gestational Age at Delivery: Unknown Type of Delivery: C/section 08/26/2013   Last weight - 9-9 oz ( this past Monday ) 1/12  Breastfeeding History: Frequency of Breastfeeding: Per mom still feeding at the breast , My milk flows so quickly the baby tends to pull away.  Per mom I stopped hand expressing and prepump ing off the fullness before latch. Also some feeding s he eats 2 mins ,  unlatches starts choking and relatches for 9 -10 mins.  Length of Feeding: 10 -12 mins and then sometimes re-latches.  Voids: > 6  Stools: 7-8 yellow ( last day greenish yellow )   Supplementing / Method: Pumping:  Type of Pump: DEBP Medela    Frequency: per mom pump when needed if baby only feeds one breast.   Volume:  upto 2 oz   Comments:    Consultation Evaluation: Both breast are full , areolas compress able and full , mom needs to hand express. Prior to latch.  Nipples -  Per mom right nipple sore, pinky red , no breakdown , no s/s of yeast . LC suspects baby it's always getting depth at the breast    Baby feeding appears healthy and gaining well , bottom pinky red , no elevated rash noted , thin coating of white on tongue ( appears normal )  Last feeding was at 1250 for . Per mom baby was content.    Initial Feeding Assessment: Pre-feed Weight: 9.0.1 oz 4086 g  Post-feed Weight: 9.8 oz   4134 g  Amount Transferred: 48 ml  Comments: left breast, depth obtained , consistent pattern , multiply swallows fed of  9- 10 mins   Additional Feeding Assessment: large wet and yellowish stool changed and re- weight  Pre-feed Weight: 8-15 .9 oz 4078 g  Post-feed Weight: 9-1.7 oz 4132 g  Amount Transferred: 54 ml  Comments: re- latched on the left with depth and mom was independent with latch. Depth achieved , per mom comfortable , fed for 12 mins   Total Breast milk Transferred this  Visit: 102 ml  Total Supplement Given: none  Hand expressed - 30 ml prior to feeding  Lactation Pan of Care -  Keep up the great efforts breast feeding - Praised mom )                                         -  Due to over production challenges -                                         - LC recommended 2 options - start tomorrow - feed the baby                                                                                                                           -  then pump both breast for 15 -20 mins and soften down to pre -pregnancy soft ness.                                                                                                                          - basically getting breast under control ,                                                                                                                          - Stressed the importance of watching breast , after softening both down well , when filling                                                                                                                              Feed the baby , hand express if needed to comfort only .                                                                          #2 option - Block feeding - Feed several feedings from the 1st breast up to 3 , release 2nd breast with hand expressing just to comfort .  Then the 4th feeding switch to the 2nd breast and feed 3 feeding s and continue .                                         - Call Elizabeth [name]( OB for script for "All purpose nipple cream " for right sore nipple                                         - With latch work on depth and breast compressions until the baby is in a good consistent pattern    Follow-Up- Per Mom - Monday 10/08/12 , with Dr. Victorino DikeJennifer [name]                    [facility]    [facility] Lactation @230  pm - for F/U due to  over production ( reassess if the above plan is working to decrease her over production)                    - LC recommended to mom to consider attending the "Feelings after Birth " on Tuesday, just before the breast feeding support group to get out of the house                      ( for breast feeding support and due to mom mentioning she was feeling frustrated and down about the breast feeding.)       Elizabeth Greathouseorio, Elizabeth [name] Ann 09/19/2013, 3:26 PM

## 2013-09-26 ENCOUNTER — Ambulatory Visit (HOSPITAL_COMMUNITY): Payer: 59

## 2014-02-07 IMAGING — US US ABDOMEN COMPLETE
1 series · 14 of 25 positions shown · non-contrast
Comparison: None.

CLINICAL DATA: Abdominal pain. Thirty-one weeks pregnant.

EXAM:
ULTRASOUND ABDOMEN COMPLETE

[Series 1: us abdomen complete · 14 of 62 slices shown]
[im 1/62]
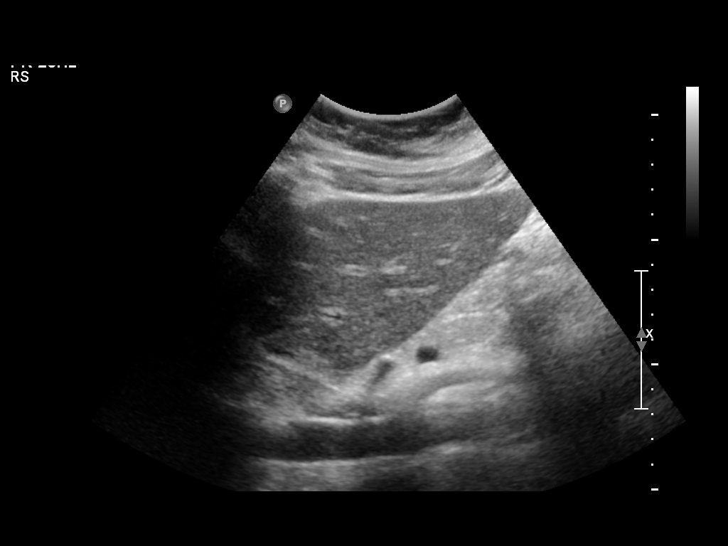
[im 6/62]
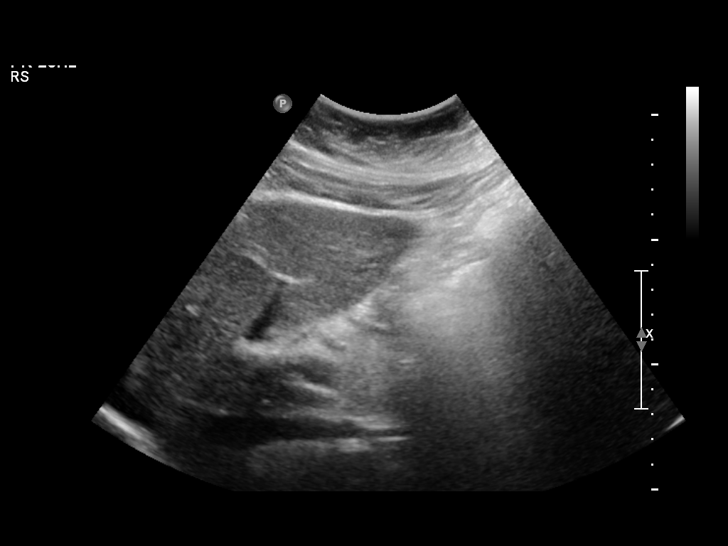
[im 11/62]
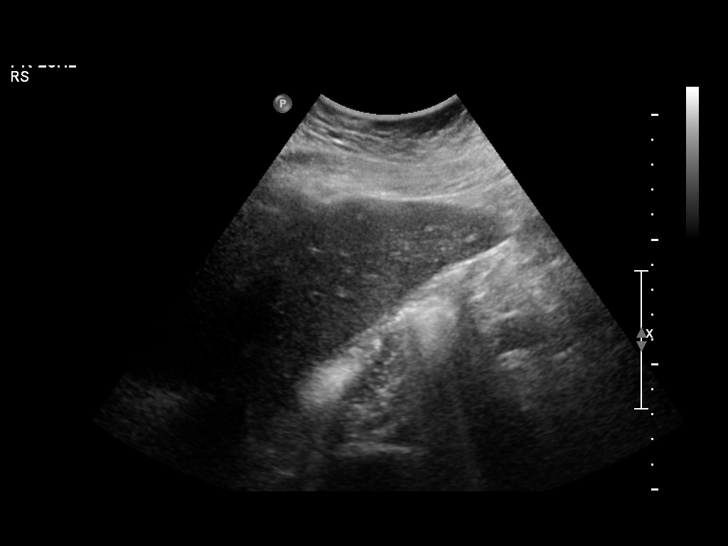
[im 16/62]
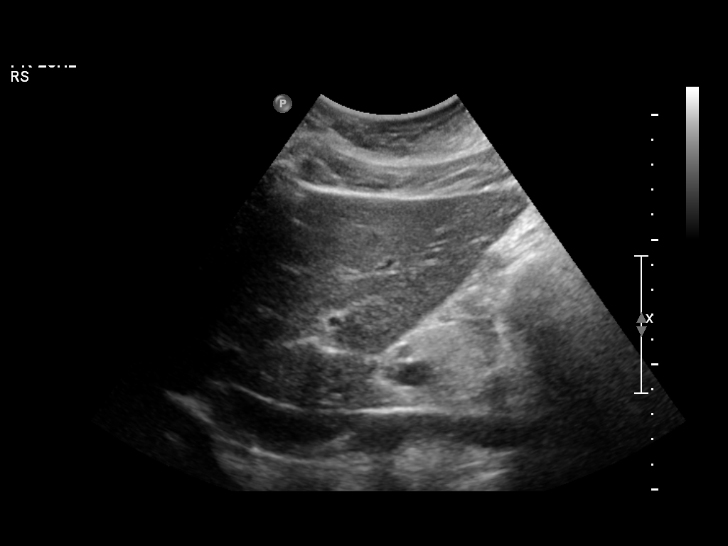
[im 21/62]
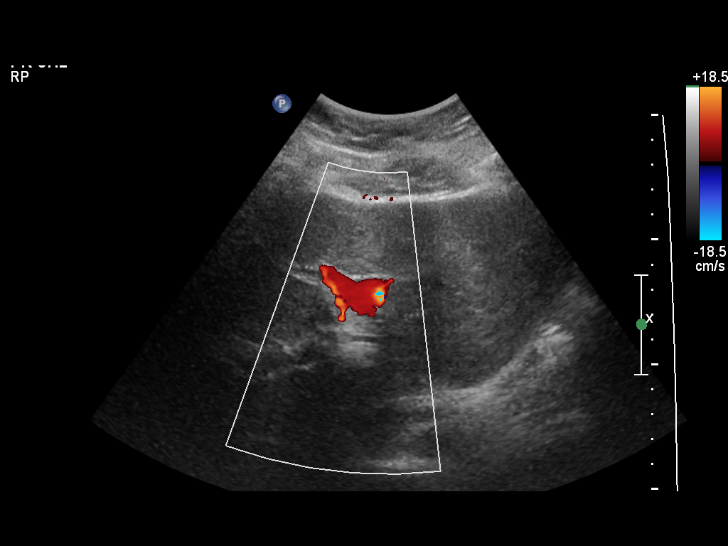
[im 23/62]
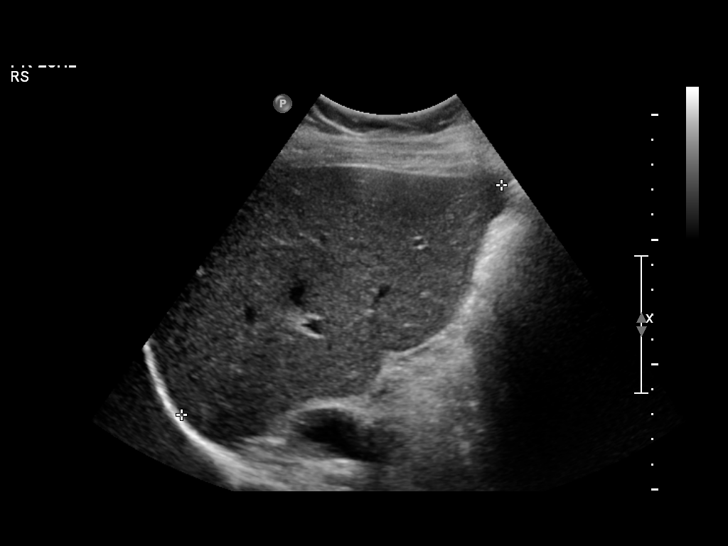
[im 28/62]
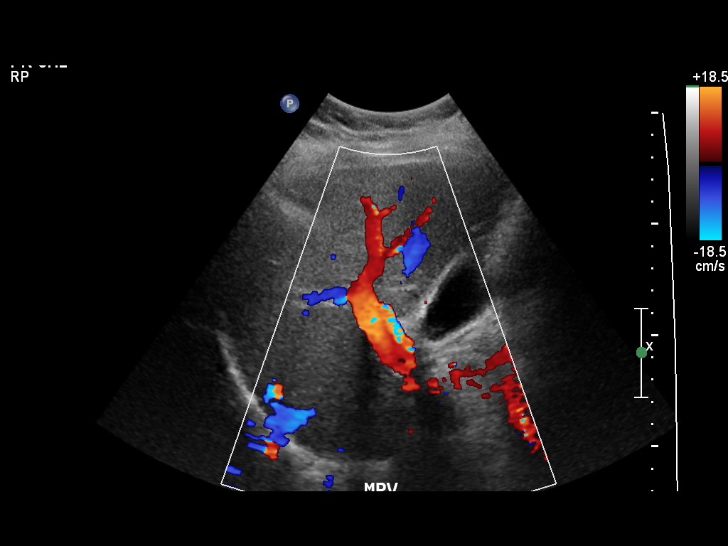
[im 34/62]
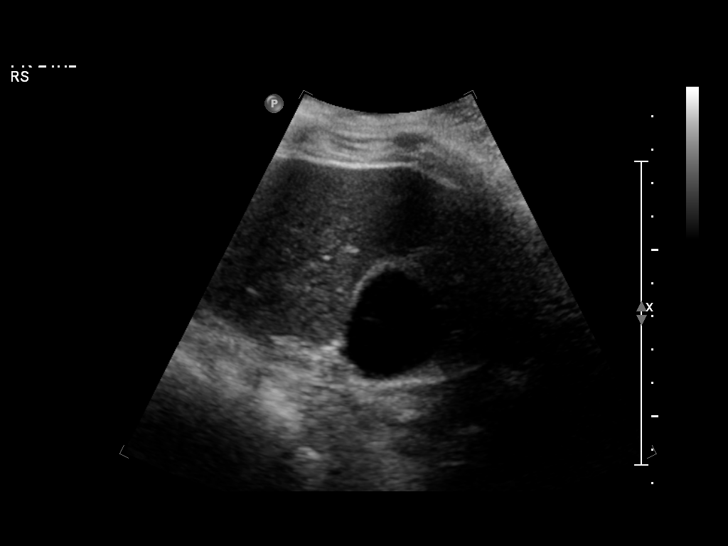
[im 39/62]
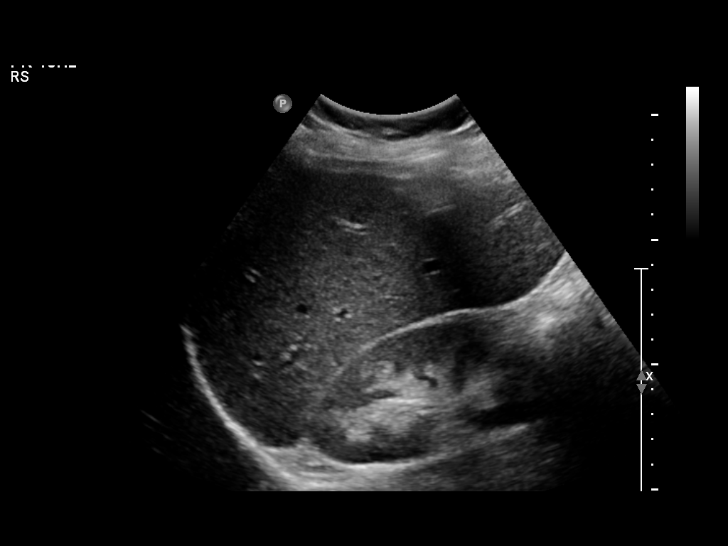
[im 41/62]
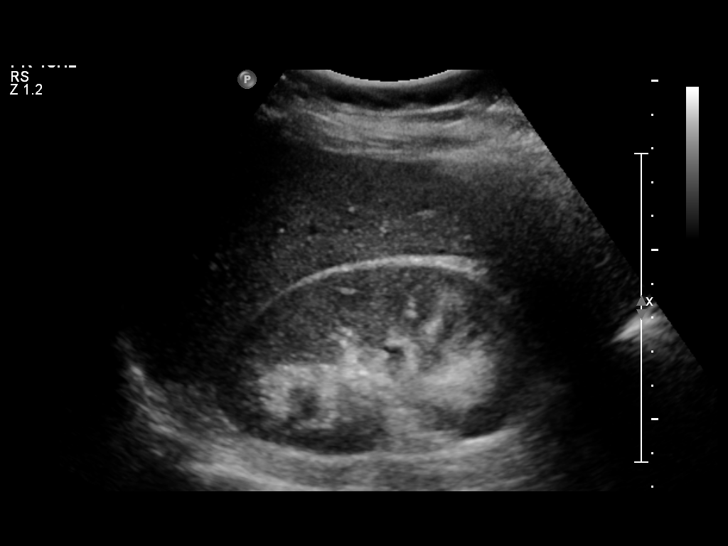
[im 46/62]
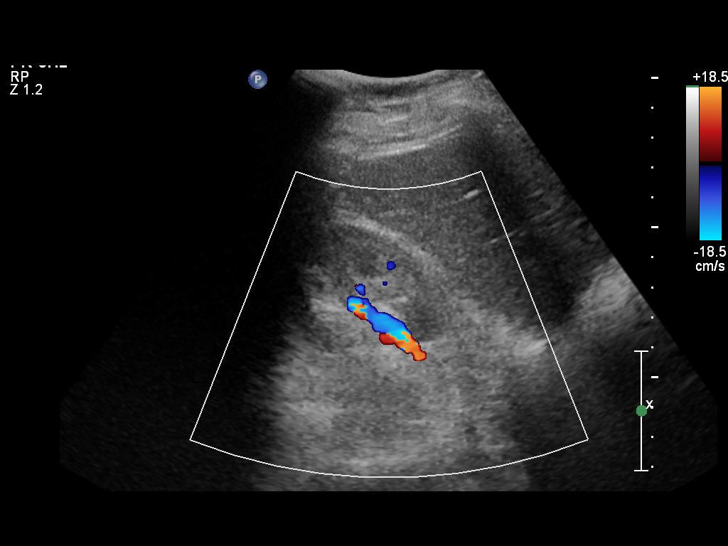
[im 51/62]
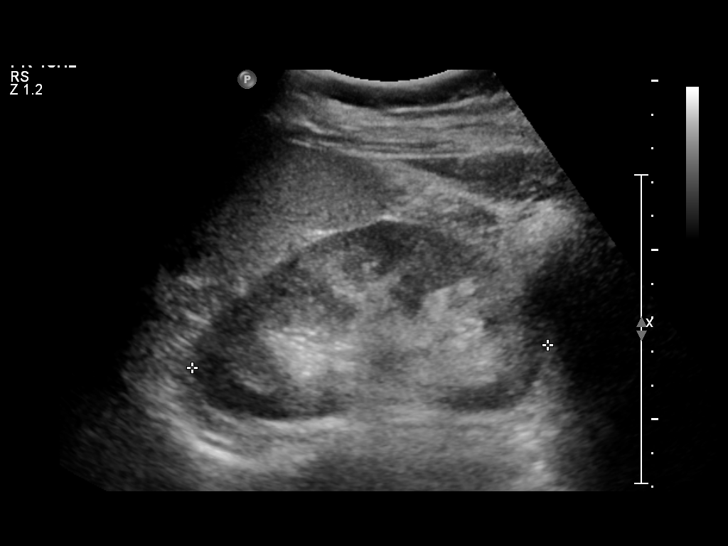
[im 56/62]
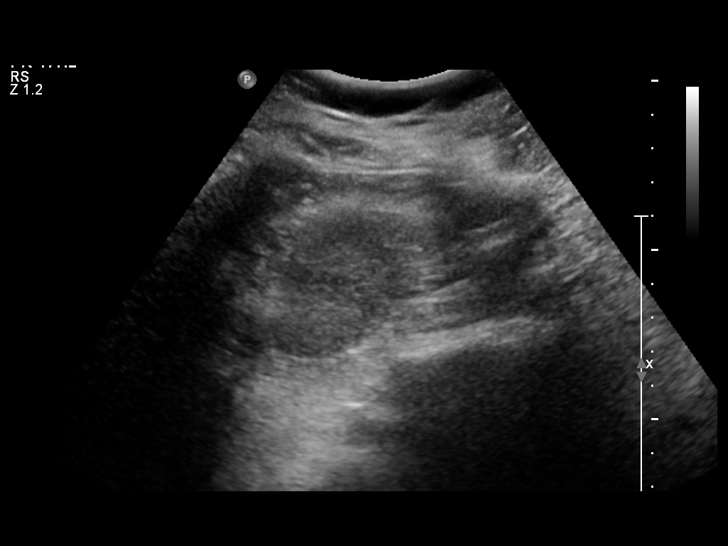
[im 62/62]
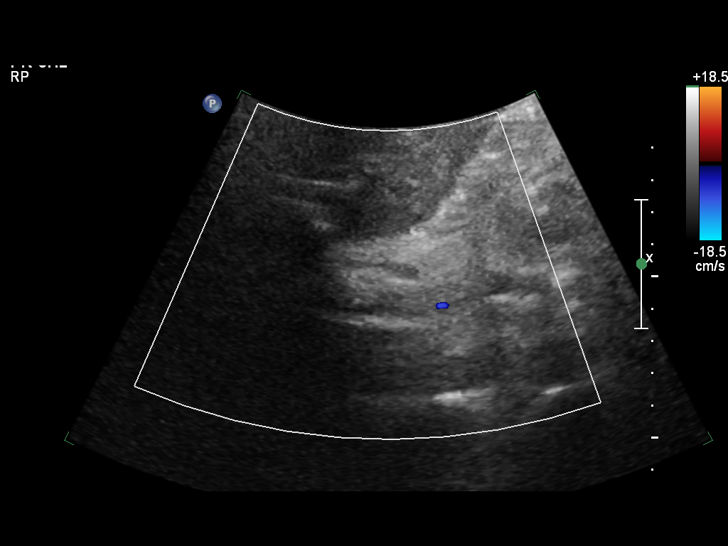

[14 of 25 positions shown; findings below may reference images not displayed]

FINDINGS: Gallbladder

No gallstones or wall thickening visualized. No sonographic Murphy
sign noted.

Common bile duct

Diameter: 2.5 mm

Liver

No focal lesion identified. Within normal limits in parenchymal
echogenicity.

IVC

No abnormality visualized.

Pancreas

Visualized portion unremarkable.

Spleen

Size and appearance within normal limits.

Right Kidney

Length: 9.6 cm. Echogenicity within normal limits. No mass or
hydronephrosis visualized.

Left Kidney

Length: 10.5 cm. Echogenicity within normal limits. No mass or
hydronephrosis visualized.

Abdominal aorta

No aneurysm visualized.
IMPRESSION: Normal abdominal ultrasound.

## 2014-07-08 ENCOUNTER — Encounter (HOSPITAL_COMMUNITY): Payer: Self-pay | Admitting: Obstetrics and Gynecology

## 2014-09-02 ENCOUNTER — Encounter: Payer: Self-pay | Admitting: Family Medicine

## 2014-09-02 ENCOUNTER — Ambulatory Visit (INDEPENDENT_AMBULATORY_CARE_PROVIDER_SITE_OTHER): Payer: 59 | Admitting: Family Medicine

## 2014-09-02 VITALS — BP 104/68 | HR 93 | Temp 98.1°F | Ht 62.0 in | Wt 111.8 lb

## 2014-09-02 DIAGNOSIS — R6889 Other general symptoms and signs: Secondary | ICD-10-CM

## 2014-09-02 DIAGNOSIS — R197 Diarrhea, unspecified: Secondary | ICD-10-CM

## 2014-09-02 LAB — POCT INFLUENZA A/B
INFLUENZA B, POC: NEGATIVE
Influenza A, POC: NEGATIVE

## 2014-09-02 NOTE — Patient Instructions (Signed)

## 2014-09-02 NOTE — Progress Notes (Signed)
HPI:  Acute visit for:  1) Stomach bug: -started 3-4 days ago -symptoms: intermittent cramping abd pain, diarrhea - watery - multiple episodes, nausea, low grade fever, body aches, a little nasal congestion -denies: fevers >100, rash, joint pain, blood in stools, dysuria  ROS: See pertinent positives and negatives per HPI.  Past Medical History  Diagnosis Date  . Asthma   . Depression   . Allergy   . Heart murmur   . Migraine   . Endometriosis   . Gestational diabetes   . Hx of varicella   . Hx of cystitis     Past Surgical History  Procedure Laterality Date  . Wisdom tooth extraction    . Cesarean section N/A 08/26/2013    Procedure: CESAREAN SECTION;  Surgeon: Meriel Picaichard M Holland, MD;  Location: [facility];  Service: Obstetrics;  Laterality: N/A;    Family History  Problem Relation Age of Onset  . Arthritis Father   . Hypertension Father   . Thyroid disease Father   . Alcohol abuse Maternal Aunt   . Alcohol abuse Maternal Uncle   . Alcohol abuse Paternal Aunt   . Alcohol abuse Paternal Uncle   . Hyperlipidemia Maternal Grandmother   . Diabetes Maternal Grandmother   . Arthritis Maternal Grandfather   . Cancer Maternal Grandfather     lung  . Hyperlipidemia Maternal Grandfather   . Hypertension Paternal Grandfather   . Alzheimer's disease Paternal Grandfather   . Alzheimer's disease Paternal Grandmother     History   Social History  . Marital Status: Married    Spouse Name: N/A    Number of Children: N/A  . Years of Education: N/A   Social History Main Topics  . Smoking status: Never Smoker   . Smokeless tobacco: None  . Alcohol Use: No  . Drug Use: No  . Sexual Activity: Yes   Other Topics Concern  . None   Social History Narrative    Current outpatient prescriptions: albuterol (PROVENTIL HFA;VENTOLIN HFA) 108 (90 BASE) MCG/ACT inhaler, Inhale 2 puffs into the lungs every 6 (six) hours as needed. For wheezing, Disp: , Rfl: ;  ibuprofen  (ADVIL,MOTRIN) 800 MG tablet, Take 1 tablet (800 mg total) by mouth every 8 (eight) hours as needed for moderate pain., Disp: 30 tablet, Rfl: 1;  levonorgestrel (MIRENA) 20 MCG/24HR IUD, 1 each by Intrauterine route once., Disp: , Rfl:  progesterone (PROMETRIUM) 100 MG capsule, Take 100 mg by mouth daily., Disp: , Rfl:   EXAM:  Filed Vitals:   09/02/14 1018  BP: 104/68  Pulse: 93  Temp: 98.1 F (36.7 C)    Body mass index is 20.44 kg/(m^2).  GENERAL: vitals reviewed and listed above, alert, oriented, appears well hydrated and in no acute distress  HEENT: atraumatic, conjunttiva clear, no obvious abnormalities on inspection of external nose and ears  NECK: no obvious masses on inspection  LUNGS: clear to auscultation bilaterally, no wheezes, rales or rhonchi, good air movement  CV: HRRR, no peripheral edema  ABD: BS+, soft, NTTP, no rebound or guarding  MS: moves all extremities without noticeable abnormality  PSYCH: pleasant and cooperative, no obvious depression or anxiety  ASSESSMENT AND PLAN:  Discussed the following assessment and plan:  Diarrhea  -we discussed possible serious and likely etiologies, workup and treatment, treatment risks and return precautions - likely viral gastroenteritis -after this discussion, Elizabeth SilversmithMackenzie opted for supportive care, flu test, follow up as needed -of course, we advised Elizabeth SilversmithMackenzie  to return or notify  a doctor immediately if symptoms worsen or persist or new concerns arise.  .  -Patient advised to return or notify a doctor immediately if symptoms worsen or persist or new concerns arise.  There are no Patient Instructions on file for this visit.   Kriste BasqueKIM, Shakeem Stern R.

## 2014-09-02 NOTE — Addendum Note (Signed)
Addended by: Johnella MoloneyFUNDERBURK, Odyn Turko A on: 09/02/2014 11:03 AM   Modules accepted: Orders

## 2014-09-02 NOTE — Progress Notes (Signed)
Pre visit review using our clinic review tool, if applicable. No additional management support is needed unless otherwise documented below in the visit note. 

## 2015-01-07 ENCOUNTER — Encounter: Payer: Self-pay | Admitting: Adult Health

## 2015-01-07 ENCOUNTER — Ambulatory Visit (INDEPENDENT_AMBULATORY_CARE_PROVIDER_SITE_OTHER): Payer: 59 | Admitting: Adult Health

## 2015-01-07 VITALS — BP 102/78 | Temp 98.2°F | Ht 62.0 in | Wt 178.7 lb

## 2015-01-07 DIAGNOSIS — H6093 Unspecified otitis externa, bilateral: Secondary | ICD-10-CM

## 2015-01-07 DIAGNOSIS — H60393 Other infective otitis externa, bilateral: Secondary | ICD-10-CM | POA: Diagnosis not present

## 2015-01-07 DIAGNOSIS — H6090 Unspecified otitis externa, unspecified ear: Secondary | ICD-10-CM | POA: Insufficient documentation

## 2015-01-07 MED ORDER — NEOMYCIN-POLYMYXIN-HC 3.5-10000-1 OT SOLN: 3.0000 [drp] | Freq: Four times a day (QID) | OTIC | Status: DC

## 2015-01-07 NOTE — Patient Instructions (Signed)
I have sent a prescription for Cortisporin to the pharmacy. Place four drops in each ear 3 times a day for 7 days. Please let me know if you have no improvement in 2-3 days or if your symptoms get worse.   You can continue to use Flonase and Zyrtec for the sinus pressure. Otitis Externa Otitis externa is a bacterial or fungal infection of the outer ear canal. This is the area from the eardrum to the outside of the ear. Otitis externa is sometimes called "swimmer's ear." CAUSES  Possible causes of infection include:  Swimming in dirty water.  Moisture remaining in the ear after swimming or bathing.  Mild injury (trauma) to the ear.  Objects stuck in the ear (foreign body).  Cuts or scrapes (abrasions) on the outside of the ear. SIGNS AND SYMPTOMS  The first symptom of infection is often itching in the ear canal. Later signs and symptoms may include swelling and redness of the ear canal, ear pain, and yellowish-white fluid (pus) coming from the ear. The ear pain may be worse when pulling on the earlobe. DIAGNOSIS  Your health care provider will perform a physical exam. A sample of fluid may be taken from the ear and examined for bacteria or fungi. TREATMENT  Antibiotic ear drops are often given for 10 to 14 days. Treatment may also include pain medicine or corticosteroids to reduce itching and swelling. HOME CARE INSTRUCTIONS   Apply antibiotic ear drops to the ear canal as prescribed by your health care provider.  Take medicines only as directed by your health care provider.  If you have diabetes, follow any additional treatment instructions from your health care provider.  Keep all follow-up visits as directed by your health care provider. PREVENTION   Keep your ear dry. Use the corner of a towel to absorb water out of the ear canal after swimming or bathing.  Avoid scratching or putting objects inside your ear. This can damage the ear canal or remove the protective wax that lines  the canal. This makes it easier for bacteria and fungi to grow.  Avoid swimming in lakes, polluted water, or poorly chlorinated pools.  You may use ear drops made of rubbing alcohol and vinegar after swimming. Combine equal parts of white vinegar and alcohol in a bottle. Put 3 or 4 drops into each ear after swimming. SEEK MEDICAL CARE IF:   You have a fever.  Your ear is still red, swollen, painful, or draining pus after 3 days.  Your redness, swelling, or pain gets worse.  You have a severe headache.  You have redness, swelling, pain, or tenderness in the area behind your ear. MAKE SURE YOU:   Understand these instructions.  Will watch your condition.  Will get help right away if you are not doing well or get worse. Document Released: 08/23/2005 Document Revised: 01/07/2014 Document Reviewed: 09/09/2011 [facility] Patient Information 2015 [employer], [place]. This information is not intended to replace advice given to you by your health care provider. Make sure you discuss any questions you have with your health care provider.

## 2015-01-07 NOTE — Progress Notes (Signed)
Pre visit review using our clinic review tool, if applicable. No additional management support is needed unless otherwise documented below in the visit note. 

## 2015-01-07 NOTE — Progress Notes (Signed)
   Subjective:    Patient ID: Elizabeth [name], female    DOB: December 18, 1980, 34 y.o.   MRN: [id number]  HPI 34 year old healthy female who presents to the office today with bilateral ear pain for the last 48 hours. She also endorses facial pain/pressure and congestion with PND. She does have slight pain with swallowing but endorses that this is a chronic issue during allergy season. She has not been using any OTC medication because she is nursing.   Denies N/V/D/Fever   Review of Systems  Constitutional: Negative.  Negative for fever, chills, activity change, appetite change and fatigue.  HENT: Positive for congestion, ear pain, postnasal drip, rhinorrhea, sinus pressure and sore throat. Negative for ear discharge, tinnitus, trouble swallowing and voice change.   Eyes: Positive for itching. Negative for pain and redness.  All other systems reviewed and are negative.      Objective:   Physical Exam  Constitutional: She is oriented to person, place, and time. She appears well-developed and well-nourished. No distress.  HENT:  Head: Normocephalic and atraumatic.  Mouth/Throat: Oropharynx is clear and moist. No oropharyngeal exudate.  Inflammation to the external auditory canal in bilateral ears. No redness or irritation to auricle  Cardiovascular: Normal rate, regular rhythm and normal heart sounds.  Exam reveals no gallop and no friction rub.   No murmur heard. Pulmonary/Chest: Effort normal and breath sounds normal. No respiratory distress. She has no wheezes. She has no rales. She exhibits no tenderness.  Musculoskeletal: Normal range of motion.  Lymphadenopathy:    She has cervical adenopathy.  Neurological: She is alert and oriented to person, place, and time.  Skin: Skin is warm and dry. She is not diaphoretic.  Psychiatric: She has a normal mood and affect. Her behavior is normal. Judgment and thought content normal.  Vitals reviewed.         Assessment & Plan:  1.  Otitis, externa, infective, bilateral - Cortisporin prescribed. Place 4 drops in each ear three times a day for 7 days.  - Follow up if no improvement in 2-3 days - Follow up sooner if symptoms worsen or has a fever greater than 101.

## 2015-02-13 ENCOUNTER — Ambulatory Visit (INDEPENDENT_AMBULATORY_CARE_PROVIDER_SITE_OTHER): Payer: 59 | Admitting: Family Medicine

## 2015-02-13 ENCOUNTER — Encounter: Payer: Self-pay | Admitting: Family Medicine

## 2015-02-13 VITALS — BP 110/80 | HR 84 | Temp 98.2°F | Wt 176.0 lb

## 2015-02-13 DIAGNOSIS — J069 Acute upper respiratory infection, unspecified: Secondary | ICD-10-CM | POA: Diagnosis not present

## 2015-02-13 DIAGNOSIS — R05 Cough: Secondary | ICD-10-CM

## 2015-02-13 DIAGNOSIS — R059 Cough, unspecified: Secondary | ICD-10-CM

## 2015-02-13 DIAGNOSIS — B9789 Other viral agents as the cause of diseases classified elsewhere: Principal | ICD-10-CM

## 2015-02-13 MED ORDER — ALBUTEROL SULFATE (2.5 MG/3ML) 0.083% IN NEBU
2.5000 mg | INHALATION_SOLUTION | RESPIRATORY_TRACT | Status: AC
  Administered 2015-02-13: 2.5 mg via RESPIRATORY_TRACT

## 2015-02-13 MED ORDER — ALBUTEROL SULFATE HFA 108 (90 BASE) MCG/ACT IN AERS: 2.0000 | INHALATION_SPRAY | Freq: Four times a day (QID) | RESPIRATORY_TRACT | Status: DC | PRN

## 2015-02-13 NOTE — Progress Notes (Signed)
Pre visit review using our clinic review tool, if applicable. No additional management support is needed unless otherwise documented below in the visit note. 

## 2015-02-13 NOTE — Progress Notes (Signed)
   Subjective:    Patient ID: Elizabeth [name], female    DOB: 18-Jul-1981, 34 y.o.   MRN: [id number]  HPI Patient seen with cough. She states about 2 weeks ago she had some bilateral hand pain which eventually improved but one-week history of cough which is mostly dry. Some mild wheezing off and on. Possible low-grade fever but not confirmed. She's had some nasal congestion. Intermittent chills. Occasional loose bowel movement. No nausea or vomiting. Mild sore throat. Mild body aches. She is still nursing-38-month-old  Past Medical History  Diagnosis Date  . Asthma   . Depression   . Allergy   . Heart murmur   . Migraine   . Endometriosis   . Gestational diabetes   . Hx of varicella   . Hx of cystitis    Past Surgical History  Procedure Laterality Date  . Wisdom tooth extraction    . Cesarean section N/A 08/26/2013    Procedure: CESAREAN SECTION;  Surgeon: Meriel Pica, MD;  Location: [facility];  Service: Obstetrics;  Laterality: N/A;    reports that she has never smoked. She does not have any smokeless tobacco history on file. She reports that she does not drink alcohol or use illicit drugs. family history includes Alcohol abuse in her maternal aunt, maternal uncle, paternal aunt, and paternal uncle; Alzheimer's disease in her paternal grandfather and paternal grandmother; Arthritis in her father and maternal grandfather; Cancer in her maternal grandfather; Diabetes in her maternal grandmother; Hyperlipidemia in her maternal grandfather and maternal grandmother; Hypertension in her father and paternal grandfather; Thyroid disease in her father. Allergies  Allergen Reactions  . Tramadol Nausea And Vomiting    GI upset      Review of Systems  Constitutional: Positive for chills and fatigue.  HENT: Positive for congestion.   Respiratory: Positive for cough. Negative for shortness of breath.   Cardiovascular: Negative for chest pain.       Objective:   Physical Exam    Constitutional: She appears well-developed and well-nourished.  HENT:  Right Ear: External ear normal.  Left Ear: External ear normal.  Mouth/Throat: Oropharynx is clear and moist.  Neck: Neck supple.  Cardiovascular: Normal rate and regular rhythm.   Pulmonary/Chest: Effort normal and breath sounds normal. No respiratory distress. She has no wheezes. She has no rales.  Lymphadenopathy:    She has no cervical adenopathy.          Assessment & Plan:  Cough and likely viral syndrome. Nonfocal exam. She had some initial wheezing and was given nebulizer with albuterol and her cough and wheezing did improve somewhat. We've reordered her albuterol MDI 2 puffs every 6 hours as needed for severe wheezing. Follow-up promptly for fever or any worsening symptoms

## 2015-06-11 ENCOUNTER — Ambulatory Visit (HOSPITAL_COMMUNITY)
Admission: RE | Admit: 2015-06-11 | Discharge: 2015-06-11 | Disposition: A | Discharge status: Home / Self Care | Payer: 59 | Source: Ambulatory Visit | Attending: Family Medicine | Admitting: Family Medicine

## 2015-06-11 ENCOUNTER — Encounter: Payer: Self-pay | Admitting: Family Medicine

## 2015-06-11 ENCOUNTER — Ambulatory Visit (INDEPENDENT_AMBULATORY_CARE_PROVIDER_SITE_OTHER): Payer: 59 | Admitting: Family Medicine

## 2015-06-11 VITALS — BP 102/80 | HR 97 | Temp 98.3°F | Ht 62.0 in | Wt 183.0 lb

## 2015-06-11 DIAGNOSIS — M546 Pain in thoracic spine: Secondary | ICD-10-CM | POA: Diagnosis not present

## 2015-06-11 DIAGNOSIS — J4520 Mild intermittent asthma, uncomplicated: Secondary | ICD-10-CM

## 2015-06-11 DIAGNOSIS — R079 Chest pain, unspecified: Secondary | ICD-10-CM | POA: Insufficient documentation

## 2015-06-11 DIAGNOSIS — J069 Acute upper respiratory infection, unspecified: Secondary | ICD-10-CM

## 2015-06-11 DIAGNOSIS — R531 Weakness: Secondary | ICD-10-CM | POA: Diagnosis not present

## 2015-06-11 DIAGNOSIS — J309 Allergic rhinitis, unspecified: Secondary | ICD-10-CM | POA: Diagnosis not present

## 2015-06-11 MED ORDER — ALBUTEROL SULFATE HFA 108 (90 BASE) MCG/ACT IN AERS: 2.0000 | INHALATION_SPRAY | Freq: Four times a day (QID) | RESPIRATORY_TRACT | Status: DC | PRN

## 2015-06-11 NOTE — Patient Instructions (Signed)
BEFORE YOU LEAVE: -upper back exercises -xray sheet -schedule follow up with your doctor in 2 days  Go get the chest xray  Heat, topical sports creams and Tylenol and aleve per instructions as needed for the back pain.  Do the exercises provided 4 days per week  Zyrtec daily  Use the albuterol as needed for any mild asthma symptoms and seek care immediately if struggling to breath, worsening or concerns.

## 2015-06-11 NOTE — Progress Notes (Signed)
Pre visit review using our clinic review tool, if applicable. No additional management support is needed unless otherwise documented below in the visit note. 

## 2015-06-11 NOTE — Progress Notes (Addendum)
The [facility] is probably will give albuterol HPI:  Elizabeth [name] is a pleasant 34 yo F with a PMH sig for currently lactating, depression, asthma, allergic rhinitis and a recent UTI, DDD and chronic back pain here for an acute visit for several issues:  1) R upper back pain: -started after dog (large dog) yanked on leash yesterday and worse today -mod pain in R mid back -denies: weakness, numbness, neck pain  2)URI: -started 5 days ago -symptoms: congestion, mild chest discomfort when takes a deep breath, malaise, swollen eyelids, pnd, sore throat, shaky -hx mild asthma and allergies - uses zyrtec intermittently, has not needed alb but needs refill -denies: DOE, SOB, swollen face or neck or lips, vomiting, diarrhea, rash -started on bactrim 2 days ago for by her ob for UTI and urinary symptoms resolving - she was worried about taking though as has remote hx of severe allergic reaction to unknown substance  ROS: See pertinent positives and negatives per HPI.  Past Medical History  Diagnosis Date  . Asthma   . Depression   . Allergy   . Heart murmur   . Migraine   . Endometriosis   . Gestational diabetes   . Hx of varicella   . Hx of cystitis     Past Surgical History  Procedure Laterality Date  . Wisdom tooth extraction    . Cesarean section N/A 08/26/2013    Procedure: CESAREAN SECTION;  Surgeon: Meriel Pica, MD;  Location: [facility];  Service: Obstetrics;  Laterality: N/A;    Family History  Problem Relation Age of Onset  . Arthritis Father   . Hypertension Father   . Thyroid disease Father   . Alcohol abuse Maternal Aunt   . Alcohol abuse Maternal Uncle   . Alcohol abuse Paternal Aunt   . Alcohol abuse Paternal Uncle   . Hyperlipidemia Maternal Grandmother   . Diabetes Maternal Grandmother   . Arthritis Maternal Grandfather   . Cancer Maternal Grandfather     lung  . Hyperlipidemia Maternal Grandfather   . Hypertension Paternal Grandfather   .  Alzheimer's disease Paternal Grandfather   . Alzheimer's disease Paternal Grandmother     Social History   Social History  . Marital Status: Married    Spouse Name: N/A  . Number of Children: N/A  . Years of Education: N/A   Social History Main Topics  . Smoking status: Never Smoker   . Smokeless tobacco: None  . Alcohol Use: No  . Drug Use: No  . Sexual Activity: Yes   Other Topics Concern  . None   Social History Narrative     Current outpatient prescriptions:  .  albuterol (PROVENTIL HFA;VENTOLIN HFA) 108 (90 BASE) MCG/ACT inhaler, Inhale 2 puffs into the lungs every 6 (six) hours as needed for wheezing or shortness of breath., Disp: 1 Inhaler, Rfl: 0 .  levonorgestrel (MIRENA) 20 MCG/24HR IUD, 1 each by Intrauterine route once., Disp: , Rfl:  .  sulfamethoxazole-trimethoprim (BACTRIM DS,SEPTRA DS) 800-160 MG tablet, Take 1 tablet by mouth 2 (two) times daily. Given by OB on 10/3 for bladder infection, Disp: , Rfl:   EXAM:  Filed Vitals:   06/11/15 1035  BP: 102/80  Pulse: 97  Temp: 98.3 F (36.8 C)    Body mass index is 33.46 kg/(m^2).  GENERAL: vitals reviewed and listed above, alert, oriented, appears well hydrated and in no acute distress  HEENT: atraumatic, conjunttiva clear, R upper eyelid mildly swollen, no obvious abnormalities on  inspection of external nose and ears, normal appearance of ear canals and TMs, clear nasal congestion, boggy turbinates, a inhaled steroid which might give her to mild post oropharyngeal erythema with PND, no tonsillar edema or exudate, no sinus TTP  NECK: no obvious masses on inspection  LUNGS: clear to auscultation bilaterally, no wheezes, rales or rhonchi, good air movement  CV: HRRR, no peripheral edema  MS: moves all extremities without noticeable abnormality  PSYCH: pleasant and cooperative, no obvious depression or anxiety  ASSESSMENT AND PLAN:  Discussed the following assessment and plan:  Right-sided thoracic  back pain  Allergic rhinitis, unspecified allergic rhinitis type  Asthma, mild intermittent, uncomplicated - Plan: DG Chest 2 View  Acute upper respiratory infection  -suspect muscular strain as cause of shoulder pain -sounds like she may be fighting a viral URI on top of her baseline allergic rhinitis with possible mild asthma symptoms - will get CXR to exclude lower resp inf -close follow up to ensure improving and advise supportive care and refilled albuterol -she reports a remote hx of a serious allergic reaction to unknown substance w/ angioedema and taking an abx made her nervous - it does not appear she is having anything like this now, and urinary symptoms are improving, but advised of return and emergency precautions should any concerning symptoms develop -Patient advised to return or notify a doctor immediately if symptoms worsen or persist or new concerns arise.  Patient Instructions  BEFORE YOU LEAVE: -upper back exercises -xray sheet -schedule follow up with your doctor in 2 days  Go get the chest xray  Heat, topical sports creams and Tylenol and aleve per instructions as needed for the back pain.  Do the exercises provided 4 days per week  Zyrtec daily  Use the albuterol as needed for any mild asthma symptoms and seek care immediately if struggling to breath, worsening or concerns.       Terressa Koyanagi  Sizes were allergic reactions and unknown substance and she was worried about but doesn't seem like she is having any symptoms just we did warn the emergency room worse

## 2015-06-13 ENCOUNTER — Ambulatory Visit (INDEPENDENT_AMBULATORY_CARE_PROVIDER_SITE_OTHER): Payer: 59 | Admitting: Family Medicine

## 2015-06-13 ENCOUNTER — Encounter: Payer: Self-pay | Admitting: Family Medicine

## 2015-06-13 VITALS — BP 110/70 | HR 95 | Temp 98.3°F | Wt 185.0 lb

## 2015-06-13 DIAGNOSIS — Z8632 Personal history of gestational diabetes: Secondary | ICD-10-CM | POA: Diagnosis not present

## 2015-06-13 DIAGNOSIS — M546 Pain in thoracic spine: Secondary | ICD-10-CM

## 2015-06-13 DIAGNOSIS — H02849 Edema of unspecified eye, unspecified eyelid: Secondary | ICD-10-CM

## 2015-06-13 DIAGNOSIS — M549 Dorsalgia, unspecified: Secondary | ICD-10-CM

## 2015-06-13 NOTE — Progress Notes (Signed)
Pre visit review using our clinic review tool, if applicable. No additional management support is needed unless otherwise documented below in the visit note. 

## 2015-06-13 NOTE — Progress Notes (Signed)
Subjective:    Patient ID: Elizabeth [name], female    DOB: [birth year], 34 y.o.   MRN: [id number]  HPI Patient is seen for follow-up regarding several items  Recent right upper back pain. She was walking a dog who jerked the leash and she felt some pain medial to her right scapula. NO shoulder pain.  For the past couple days she has been doing some heat and ice and Tylenol and back pain is improved. No cervical neck pain. No right shoulder pain.  She's had some mild swelling of both upper eyelids over the past few days. No conjunctivitis symptoms. No skin rash. She is taking Septra DS from her gynecologist for UTI and is on day 4. She has not had any angioedema symptoms such as lip or tongue edema.  History of gestational diabetes. She has not had any blood sugar since her pregnancy. Her youngest is 65 months old. She had grandparent with type 2 diabetes but neither parent. Denies any urine frequency or thirst.  Past Medical History  Diagnosis Date  . Asthma   . Depression   . Allergy   . Heart murmur   . Migraine   . Endometriosis   . Gestational diabetes   . Hx of varicella   . Hx of cystitis    Past Surgical History  Procedure Laterality Date  . Wisdom tooth extraction    . Cesarean section N/A 08/26/2013    Procedure: CESAREAN SECTION;  Surgeon: Meriel Pica, MD;  Location: [facility];  Service: Obstetrics;  Laterality: N/A;    reports that she has never smoked. She does not have any smokeless tobacco history on file. She reports that she does not drink alcohol or use illicit drugs. family history includes Alcohol abuse in her maternal aunt, maternal uncle, paternal aunt, and paternal uncle; Alzheimer's disease in her paternal grandfather and paternal grandmother; Arthritis in her father and maternal grandfather; Cancer in her maternal grandfather; Diabetes in her maternal grandmother; Hyperlipidemia in her maternal grandfather and maternal grandmother; Hypertension in her  father and paternal grandfather; Thyroid disease in her father. Allergies  Allergen Reactions  . Tramadol Nausea And Vomiting    GI upset      Review of Systems  Constitutional: Negative for fever and chills.  Eyes: Negative for pain, discharge, redness, itching and visual disturbance.  Respiratory: Negative for shortness of breath.   Cardiovascular: Negative for chest pain.  Genitourinary: Negative for dysuria.  Musculoskeletal: Positive for back pain.  Skin: Negative for rash.  Neurological: Negative for weakness.  Hematological: Negative for adenopathy.       Objective:   Physical Exam  Constitutional: She appears well-developed and well-nourished. No distress.  Neck: Neck supple. No thyromegaly present.  Cardiovascular: Normal rate and regular rhythm.   Pulmonary/Chest: Effort normal and breath sounds normal. No respiratory distress. She has no wheezes. She has no rales.  Musculoskeletal:  Full range of motion right shoulder. She has some minimal tenderness palpating musculature medial to right scapula. No spinal tenderness  Lymphadenopathy:    She has no cervical adenopathy.          Assessment & Plan:  #1 right upper back pain. Suspect musculoskeletal soft tissue injury. Improving. Continued heat or ice and topical massage #2 history of gestational diabetes. Wrote lab for future lab for fasting glucose and A1c. She will consider returning in a couple weeks for that #3 recent UTI. She describes some bilateral eyelid edema but has no evidence whatsoever for  edema today. We have encouraged her to discontinue Septra this time as she has completed over 3 days and is asymptomatic

## 2015-07-01 ENCOUNTER — Other Ambulatory Visit: Payer: 59

## 2015-07-03 ENCOUNTER — Other Ambulatory Visit (INDEPENDENT_AMBULATORY_CARE_PROVIDER_SITE_OTHER): Payer: 59

## 2015-07-03 DIAGNOSIS — Z23 Encounter for immunization: Secondary | ICD-10-CM | POA: Diagnosis not present

## 2015-07-03 DIAGNOSIS — Z8632 Personal history of gestational diabetes: Secondary | ICD-10-CM | POA: Diagnosis not present

## 2015-07-03 LAB — BASIC METABOLIC PANEL
BUN: 13 mg/dL (ref 6–23)
CHLORIDE: 104 meq/L (ref 96–112)
CO2: 28 meq/L (ref 19–32)
Calcium: 9.7 mg/dL (ref 8.4–10.5)
Creatinine, Ser: 0.73 mg/dL (ref 0.40–1.20)
GFR: 97.79 mL/min (ref >60.00)
Glucose, Bld: 102 mg/dL — ABNORMAL HIGH (ref 70–99)
Potassium: 4.5 mEq/L (ref 3.5–5.1)
SODIUM: 140 meq/L (ref 135–145)

## 2015-07-03 LAB — HEMOGLOBIN A1C: Hgb A1c MFr Bld: 5.5 % (ref 4.6–6.5)

## 2015-11-17 ENCOUNTER — Encounter: Payer: Self-pay | Admitting: Adult Health

## 2015-11-17 ENCOUNTER — Ambulatory Visit (INDEPENDENT_AMBULATORY_CARE_PROVIDER_SITE_OTHER): Payer: BLUE CROSS/BLUE SHIELD | Admitting: Adult Health

## 2015-11-17 VITALS — BP 108/80 | HR 104 | Temp 98.1°F | Ht 62.0 in | Wt 184.2 lb

## 2015-11-17 DIAGNOSIS — J069 Acute upper respiratory infection, unspecified: Secondary | ICD-10-CM

## 2015-11-17 MED ORDER — HYDROCODONE-HOMATROPINE 5-1.5 MG/5ML PO SYRP: 5.0000 mL | ORAL_SOLUTION | Freq: Three times a day (TID) | ORAL | Status: DC | PRN

## 2015-11-17 MED ORDER — DOXYCYCLINE HYCLATE 100 MG PO CAPS: 100.0000 mg | ORAL_CAPSULE | Freq: Two times a day (BID) | ORAL | Status: DC

## 2015-11-17 MED ORDER — MAGIC MOUTHWASH W/LIDOCAINE: 5.0000 mL | Freq: Three times a day (TID) | ORAL | Status: DC | PRN

## 2015-11-17 MED ORDER — FLUCONAZOLE 150 MG PO TABS: 150.0000 mg | ORAL_TABLET | Freq: Once | ORAL | Status: DC

## 2015-11-17 NOTE — Progress Notes (Signed)
Pre visit review using our clinic review tool, if applicable. No additional management support is needed unless otherwise documented below in the visit note. 

## 2015-11-17 NOTE — Progress Notes (Signed)
Subjective:    Patient ID: Elizabeth [name], female    DOB: 07/07/1981, 35 y.o.   MRN: [id number]  HPI  35 year old female who presents to the office today for URI type symptoms.10 days ago she had a fever up to 102F,her fever lasted for about 5 days. She no longer has a fever but she does complain of a nonproductive cough, chest congestion, nasal congestion, sinus pain and pressure, and shortness of breath.  Review of Systems  Constitutional: Positive for fever, chills, diaphoresis, activity change, appetite change and fatigue.  HENT: Positive for congestion, ear pain, postnasal drip, rhinorrhea, sinus pressure and sore throat. Negative for ear discharge, tinnitus, trouble swallowing and voice change.   Eyes: Negative.   Respiratory: Positive for cough and shortness of breath. Negative for wheezing.   Cardiovascular: Negative.   Neurological: Positive for headaches.  Psychiatric/Behavioral: Positive for sleep disturbance.  All other systems reviewed and are negative.  Past Medical History  Diagnosis Date  . Asthma   . Depression   . Allergy   . Heart murmur   . Migraine   . Endometriosis   . Gestational diabetes   . Hx of varicella   . Hx of cystitis     Social History   Social History  . Marital Status: Married    Spouse Name: N/A  . Number of Children: N/A  . Years of Education: N/A   Occupational History  . Not on file.   Social History Main Topics  . Smoking status: Never Smoker   . Smokeless tobacco: Not on file  . Alcohol Use: No  . Drug Use: No  . Sexual Activity: Yes   Other Topics Concern  . Not on file   Social History Narrative    Past Surgical History  Procedure Laterality Date  . Wisdom tooth extraction    . Cesarean section N/A 08/26/2013    Procedure: CESAREAN SECTION;  Surgeon: Meriel Picaichard M Holland, MD;  Location: [facility];  Service: Obstetrics;  Laterality: N/A;    Family History  Problem Relation Age of Onset  . Arthritis Father   .  Hypertension Father   . Thyroid disease Father   . Alcohol abuse Maternal Aunt   . Alcohol abuse Maternal Uncle   . Alcohol abuse Paternal Aunt   . Alcohol abuse Paternal Uncle   . Hyperlipidemia Maternal Grandmother   . Diabetes Maternal Grandmother   . Arthritis Maternal Grandfather   . Cancer Maternal Grandfather     lung  . Hyperlipidemia Maternal Grandfather   . Hypertension Paternal Grandfather   . Alzheimer's disease Paternal Grandfather   . Alzheimer's disease Paternal Grandmother     Allergies  Allergen Reactions  . Tramadol Nausea And Vomiting    GI upset    Current Outpatient Prescriptions on File Prior to Visit  Medication Sig Dispense Refill  . albuterol (PROVENTIL HFA;VENTOLIN HFA) 108 (90 BASE) MCG/ACT inhaler Inhale 2 puffs into the lungs every 6 (six) hours as needed for wheezing or shortness of breath. 1 Inhaler 0   No current facility-administered medications on file prior to visit.    BP 108/80 mmHg  Pulse 104  Temp(Src) 98.1 F (36.7 C) (Oral)  Ht 5\' 2"  (1.575 m)  Wt 184 lb 3.2 oz (83.553 kg)  BMI 33.68 kg/m2  SpO2 97%  Breastfeeding? No       Objective:   Physical Exam  Constitutional: She is oriented to person, place, and time. She appears well-developed and well-nourished.  No distress.  HENT:  Head: Normocephalic and atraumatic.  Right Ear: External ear normal.  Left Ear: External ear normal.  Nose: Mucosal edema and rhinorrhea present. Right sinus exhibits maxillary sinus tenderness and frontal sinus tenderness. Left sinus exhibits maxillary sinus tenderness and frontal sinus tenderness.  Mouth/Throat: Oropharynx is clear and moist. No oropharyngeal exudate.  Eyes: Conjunctivae and EOM are normal. Pupils are equal, round, and reactive to light. Right eye exhibits no discharge. Left eye exhibits no discharge. No scleral icterus.  Neck: Normal range of motion. Neck supple. No thyromegaly present.  Cardiovascular: Normal rate, regular  rhythm, normal heart sounds and intact distal pulses.  Exam reveals no gallop and no friction rub.   No murmur heard. Pulmonary/Chest: Effort normal and breath sounds normal. No respiratory distress. She has no wheezes. She has no rhonchi. She has no rales. She exhibits no tenderness.  Musculoskeletal: Normal range of motion. She exhibits no edema or tenderness.  Lymphadenopathy:    She has cervical adenopathy.  Neurological: She is alert and oriented to person, place, and time.  Skin: Skin is warm and dry. No rash noted. She is not diaphoretic. No erythema. No pallor.  Psychiatric: She has a normal mood and affect. Her behavior is normal. Judgment and thought content normal.  Nursing note and vitals reviewed.     Assessment & Plan:  1. URI (upper respiratory infection) - doxycycline (VIBRAMYCIN) 100 MG capsule; Take 1 capsule (100 mg total) by mouth 2 (two) times daily.  Dispense: 20 capsule; Refill: 0 - fluconazole (DIFLUCAN) 150 MG tablet; Take 1 tablet (150 mg total) by mouth once.  Dispense: 1 tablet; Refill: 1 - HYDROcodone-homatropine (HYCODAN) 5-1.5 MG/5ML syrup; Take 5 mLs by mouth every 8 (eight) hours as needed for cough.  Dispense: 120 mL; Refill: 0 - magic mouthwash w/lidocaine SOLN; Take 5 mLs by mouth 3 (three) times daily as needed for mouth pain. Equal parts Benadryl, Maalox, and Lidocaine  Dispense: 180 mL; Refill: 0 - Follow up if no improvement in the next 2-3 days.

## 2015-11-17 NOTE — Patient Instructions (Signed)
It was great seeing you again!  I am sorry you are feeling so badly.   I have sent in a prescription for Doxycycline, Hycodan Cough Syrup, and Magic Mouthwash With Lidocaine. Take as directed.   Follow up if no improvement   General Recommendations:    Please drink plenty of fluids.  Get plenty of rest   Sleep in humidified air  Use saline nasal sprays  Netti pot   OTC Medications:  Decongestants - helps relieve congestion   Flonase (generic fluticasone) or Nasacort (generic triamcinolone) - please make sure to use the "cross-over" technique at a 45 degree angle towards the opposite eye as opposed to straight up the nasal passageway.   Sudafed (generic pseudoephedrine - Note this is the one that is available behind the pharmacy counter); Products with phenylephrine (-PE) may also be used but is often not as effective as pseudoephedrine.   If you have HIGH BLOOD PRESSURE - Coricidin HBP; AVOID any product that is -D as this contains pseudoephedrine which may increase your blood pressure.  Afrin (oxymetazoline) every 6-8 hours for up to 3 days.   Allergies - helps relieve runny nose, itchy eyes and sneezing   Claritin (generic loratidine), Allegra (fexofenidine), or Zyrtec (generic cyrterizine) for runny nose. These medications should not cause drowsiness.  Note - Benadryl (generic diphenhydramine) may be used however may cause drowsiness  Cough -   Delsym or Robitussin (generic dextromethorphan)  Expectorants - helps loosen mucus to ease removal   Mucinex (generic guaifenesin) as directed on the package.  Headaches / General Aches   Tylenol (generic acetaminophen) - DO NOT EXCEED 3 grams (3,000 mg) in a 24 hour time period  Advil/Motrin (generic ibuprofen)   Sore Throat -   Salt water gargle   Chloraseptic (generic benzocaine) spray or lozenges / Sucrets (generic dyclonine)    Sinusitis Sinusitis is redness, soreness, and inflammation of the paranasal  sinuses. Paranasal sinuses are air pockets within the bones of your face (beneath the eyes, the middle of the forehead, or above the eyes). In healthy paranasal sinuses, mucus is able to drain out, and air is able to circulate through them by way of your nose. However, when your paranasal sinuses are inflamed, mucus and air can become trapped. This can allow bacteria and other germs to grow and cause infection. Sinusitis can develop quickly and last only a short time (acute) or continue over a long period (chronic). Sinusitis that lasts for more than 12 weeks is considered chronic.  CAUSES  Causes of sinusitis include:  Allergies.  Structural abnormalities, such as displacement of the cartilage that separates your nostrils (deviated septum), which can decrease the air flow through your nose and sinuses and affect sinus drainage.  Functional abnormalities, such as when the small hairs (cilia) that line your sinuses and help remove mucus do not work properly or are not present. SIGNS AND SYMPTOMS  Symptoms of acute and chronic sinusitis are the same. The primary symptoms are pain and pressure around the affected sinuses. Other symptoms include:  Upper toothache.  Earache.  Headache.  Bad breath.  Decreased sense of smell and taste.  A cough, which worsens when you are lying flat.  Fatigue.  Fever.  Thick drainage from your nose, which often is green and may contain pus (purulent).  Swelling and warmth over the affected sinuses. DIAGNOSIS  Your health care provider will perform a physical exam. During the exam, your health care provider may:  Look in your nose  for signs of abnormal growths in your nostrils (nasal polyps).  Tap over the affected sinus to check for signs of infection.  View the inside of your sinuses (endoscopy) using an imaging device that has a light attached (endoscope). If your health care provider suspects that you have chronic sinusitis, one or more of the  following tests may be recommended:  Allergy tests.  Nasal culture. A sample of mucus is taken from your nose, sent to a lab, and screened for bacteria.  Nasal cytology. A sample of mucus is taken from your nose and examined by your health care provider to determine if your sinusitis is related to an allergy. TREATMENT  Most cases of acute sinusitis are related to a viral infection and will resolve on their own within 10 days. Sometimes medicines are prescribed to help relieve symptoms (pain medicine, decongestants, nasal steroid sprays, or saline sprays).  However, for sinusitis related to a bacterial infection, your health care provider will prescribe antibiotic medicines. These are medicines that will help kill the bacteria causing the infection.  Rarely, sinusitis is caused by a fungal infection. In theses cases, your health care provider will prescribe antifungal medicine. For some cases of chronic sinusitis, surgery is needed. Generally, these are cases in which sinusitis recurs more than 3 times per year, despite other treatments. HOME CARE INSTRUCTIONS   Drink plenty of water. Water helps thin the mucus so your sinuses can drain more easily.  Use a humidifier.  Inhale steam 3 to 4 times a day (for example, sit in the bathroom with the shower running).  Apply a warm, moist washcloth to your face 3 to 4 times a day, or as directed by your health care provider.  Use saline nasal sprays to help moisten and clean your sinuses.  Take medicines only as directed by your health care provider.  If you were prescribed either an antibiotic or antifungal medicine, finish it all even if you start to feel better. SEEK IMMEDIATE MEDICAL CARE IF:  You have increasing pain or severe headaches.  You have nausea, vomiting, or drowsiness.  You have swelling around your face.  You have vision problems.  You have a stiff neck.  You have difficulty breathing. MAKE SURE YOU:   Understand  these instructions.  Will watch your condition.  Will get help right away if you are not doing well or get worse. Document Released: 08/23/2005 Document Revised: 01/07/2014 Document Reviewed: 09/07/2011 [facility] Patient Information 2015 [place], [place]. This information is not intended to replace advice given to you by your health care provider. Make sure you discuss any questions you have with your health care provider.

## 2015-12-23 ENCOUNTER — Ambulatory Visit (INDEPENDENT_AMBULATORY_CARE_PROVIDER_SITE_OTHER): Payer: BLUE CROSS/BLUE SHIELD | Admitting: Family Medicine

## 2015-12-23 ENCOUNTER — Encounter: Payer: Self-pay | Admitting: Family Medicine

## 2015-12-23 VITALS — BP 120/70 | HR 88 | Temp 98.5°F | Resp 12 | Wt 187.5 lb

## 2015-12-23 DIAGNOSIS — J069 Acute upper respiratory infection, unspecified: Secondary | ICD-10-CM

## 2015-12-23 DIAGNOSIS — R112 Nausea with vomiting, unspecified: Secondary | ICD-10-CM

## 2015-12-23 DIAGNOSIS — J4520 Mild intermittent asthma, uncomplicated: Secondary | ICD-10-CM

## 2015-12-23 LAB — POCT INFLUENZA A: Rapid Influenza A Ag: NEGATIVE

## 2015-12-23 MED ORDER — FLUTICASONE PROPIONATE 50 MCG/ACT NA SUSP: 1.0000 | Freq: Two times a day (BID) | NASAL | Status: DC

## 2015-12-23 MED ORDER — ONDANSETRON HCL 4 MG PO TABS: 4.0000 mg | ORAL_TABLET | Freq: Three times a day (TID) | ORAL | Status: DC | PRN

## 2015-12-23 MED ORDER — ALBUTEROL SULFATE HFA 108 (90 BASE) MCG/ACT IN AERS: 2.0000 | INHALATION_SPRAY | Freq: Four times a day (QID) | RESPIRATORY_TRACT | Status: AC | PRN

## 2015-12-23 MED ORDER — PREDNISONE 20 MG PO TABS: 40.0000 mg | ORAL_TABLET | Freq: Every day | ORAL | Status: DC

## 2015-12-23 MED ORDER — BENZONATATE 100 MG PO CAPS: ORAL_CAPSULE | ORAL | Status: DC

## 2015-12-23 NOTE — Progress Notes (Signed)
Subjective:    Patient ID: Elizabeth [name], female    DOB: 12-17-1980, 35 y.o.   MRN: [id number]  HPI  [name] is a 35 y.o.female here today complaining of 5-6 days of respiratory symptoms. She had fever initially, lasted about 3 days, last times she had fever was 3-4 days ago. + Nasal congestion, rhinorrhea, and post nasal drainage. + Odynophagia, exacerbated by cough. Ears feel "clogged-up." No earache or hearing loss.  No Hx of recent travel. 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 No known insect bite. No Hx of allergies, she has noted wheezing,mainly at night.She has Albuterol inh, which she has not used in months. She has not noted dyspnea or chest pain. + Nausea, vomiting, and epigastric ,"mild", dull, intermittent, non radiated abdominal pain. Symptoms are exacerbated by cough spells. No changes in bowel habits or urinary symptoms.  She has tried OTC cold medications. Symptoms otherwise stable.  She is also complaining of "intense" 3 days of lower back pain, it was max 5/10, nonradiated, already resolved. She is reporting history of "herniated disc". She denies any numbness, tingling, weakness of lower extremities. She has not had any saddle anesthesia, bowel/urinary incontinence. She has not noted any rash. Yesterday she started with "severe" lower extremity pain, she has not noticed any erythema or erythema. Today pain seems to be better, described as aching/soreness, and mainly localized in joints.No limitation of ROM.   Review of Systems  Constitutional: Positive for fever (resolved.) and fatigue. Negative for appetite change.  HENT: Positive for congestion, postnasal drip and sore throat. Negative for ear discharge, ear pain, facial swelling, mouth sores, sinus pressure, sneezing, trouble swallowing and voice change.   Eyes: Positive for discharge (epiphora). Negative for redness and visual disturbance.  Respiratory: Positive for cough and  wheezing. Negative for shortness of breath.   Cardiovascular: Negative.   Gastrointestinal: Positive for nausea, vomiting and abdominal pain (epigastric, dull, mild). Negative for diarrhea.  Genitourinary: Negative for dysuria, hematuria and menstrual problem (LMP 3 weeks ago).  Musculoskeletal: Positive for myalgias, back pain and arthralgias. Negative for joint swelling and neck pain.  Skin: Negative for rash.  Allergic/Immunologic: Negative for environmental allergies.  Neurological: Negative for weakness, numbness and headaches.  Hematological: Negative for adenopathy. Does not bruise/bleed easily.     No current outpatient prescriptions on file prior to visit.   No current facility-administered medications on file prior to visit.     Past Medical History  Diagnosis Date  . Asthma   . Depression   . Allergy   . Heart murmur   . Migraine   . Endometriosis   . Gestational diabetes   . Hx of varicella   . Hx of cystitis     Social History   Social History  . Marital Status: Married    Spouse Name: N/A  . Number of Children: N/A  . Years of Education: N/A   Social History Main Topics  . Smoking status: Never Smoker   . Smokeless tobacco: None  . Alcohol Use: No  . Drug Use: No  . Sexual Activity: Yes   Other Topics Concern  . None   Social History Narrative    Filed Vitals:   12/23/15 1301  BP: 120/70  Pulse: 88  Temp: 98.5 F (36.9 C)  Resp: 12   Body mass index is 34.29 kg/(m^2).  SpO2 Readings from Last 3 Encounters:  12/23/15 97%  11/17/15 97%  06/11/15 97%  Objective:   Physical Exam  Constitutional: She is oriented to person, place, and time. She appears well-developed. She does not appear ill. No distress.  HENT:  Head: Atraumatic.  Right Ear: External ear and ear canal normal. Tympanic membrane is not erythematous.  Left Ear: Tympanic membrane, external ear and ear canal normal.  Nose: Rhinorrhea present. No mucosal edema. Right  sinus exhibits no maxillary sinus tenderness and no frontal sinus tenderness. Left sinus exhibits no maxillary sinus tenderness and no frontal sinus tenderness.  Mouth/Throat: Uvula is midline and mucous membranes are normal. Posterior oropharyngeal erythema (mild) present. No oropharyngeal exudate or posterior oropharyngeal edema.  Hypertrophic turbinates, post nasal drainage, middle ear effusion (mild) right. Nasal voice.  Eyes: Conjunctivae are normal.  Cardiovascular: Normal rate, regular rhythm and normal heart sounds.   No murmur heard. Pulses:      Dorsalis pedis pulses are 2+ on the right side, and 2+ on the left side.  Pulmonary/Chest: Effort normal and breath sounds normal. No respiratory distress. She has no wheezes. She has no rales.  Occasional dry cough during OV.  Abdominal: Soft. She exhibits no mass. There is no hepatosplenomegaly. There is tenderness (mild epigastric "discomfort"). There is no rigidity, no rebound and no guarding.  Musculoskeletal:       Lumbar back: She exhibits no tenderness.       Right lower leg: She exhibits no tenderness and no edema.       Left lower leg: She exhibits no tenderness and no edema.  No signs of synovitis.   Lymphadenopathy:    She has no cervical adenopathy.  Neurological: She is alert and oriented to person, place, and time. Gait normal.  Skin: Skin is warm. No rash noted.  Psychiatric: She has a normal mood and affect.  Well groomed, good eye contact.  Nursing note and vitals reviewed.      Assessment & Plan:   Elizabeth [name] was seen today for uri.  Diagnoses and all orders for this visit:  URI, acute  We discussed potential/likely etiologies, with influenza being likely or possible vs. viral infection or other. I think it is a viral illness. We discussed  potential complications, signs of developing a serious illness and return and emergency precautions. Seems like some symptoms have improved. Some like congestion and cough  can last a few days or even weeks.   -     benzonatate (TESSALON) 100 MG capsule; 1-2 caps tid as needed. -     fluticasone (FLONASE) 50 MCG/ACT nasal spray; Place 1 spray into both nostrils 2 (two) times daily.  Nausea and vomiting, vomiting of unspecified type Symptomatic treatment with Zofran recommended. Seems to be aggravated/caused by cough spells.   -     ondansetron (ZOFRAN) 4 MG tablet; Take 1 tablet (4 mg total) by mouth every 8 (eight) hours as needed for nausea or vomiting.  Asthma, mild intermittent, uncomplicated  I do not hear wheezing today. Prednisone side effects discussed. Albuterol inh for a week qid then as needed.  -     predniSONE (DELTASONE) 20 MG tablet; Take 2 tablets (40 mg total) by mouth daily with breakfast. -     albuterol (PROVENTIL HFA;VENTOLIN HFA) 108 (90 Base) MCG/ACT inhaler; Inhale 2 puffs into the lungs every 6 (six) hours as needed for wheezing or shortness of breath.  Clearly instructed about warning signs, recommended following if symptoms are persistent for 1-2 weeks, before if any worsening symptom. She voices understanding. She states that she does  not need excuse note for work, it was offered.   Elizabeth Stmarie G. [place], MD  [facility]. [place] office.

## 2015-12-23 NOTE — Patient Instructions (Addendum)
A few things to remember from today's visit:   1. URI, acute  - benzonatate (TESSALON) 100 MG capsule; 1-2 caps tid as needed.  Dispense: 30 capsule; Refill: 0 - fluticasone (FLONASE) 50 MCG/ACT nasal spray; Place 1 spray into both nostrils 2 (two) times daily.  Dispense: 16 g; Refill: 3  2. Nausea and vomiting, vomiting of unspecified type  - ondansetron (ZOFRAN) 4 MG tablet; Take 1 tablet (4 mg total) by mouth every 8 (eight) hours as needed for nausea or vomiting.  Dispense: 15 tablet; Refill: 0  3. Asthma, mild intermittent, uncomplicated  - predniSONE (DELTASONE) 20 MG tablet; Take 2 tablets (40 mg total) by mouth daily with breakfast.  Dispense: 6 tablet; Refill: 0 - albuterol (PROVENTIL HFA;VENTOLIN HFA) 108 (90 Base) MCG/ACT inhaler; Inhale 2 puffs into the lungs every 6 (six) hours as needed for wheezing or shortness of breath.  Dispense: 1 Inhaler; Refill: 1      If you sign-up for My chart, you can communicate easier with us in case you have any question or concern.     Flu test negative.  viral infections are self-limited and treatment is symptomatic. Tylenol and/or Ibuprofen may help with symptoms. Plenty of fluids. Honey may help with cough. Cough and nasal congestion could last a few days and sometimes weeks. Please follow in not any better in 1-2 weeks or if worsening symptoms.   Prednisone with breakfast, so start tomorrow.

## 2015-12-23 NOTE — Progress Notes (Signed)
Pre visit review using our clinic review tool, if applicable. No additional management support is needed unless otherwise documented below in the visit note. 

## 2016-01-23 IMAGING — DX DG CHEST 2V
2 series · 2 of 2 positions shown · non-contrast
Comparison: None.

CLINICAL DATA: Upper respiratory tract symptoms with generalized
weakness and chest pain

EXAM:
CHEST - 2 VIEW

[chest pa]
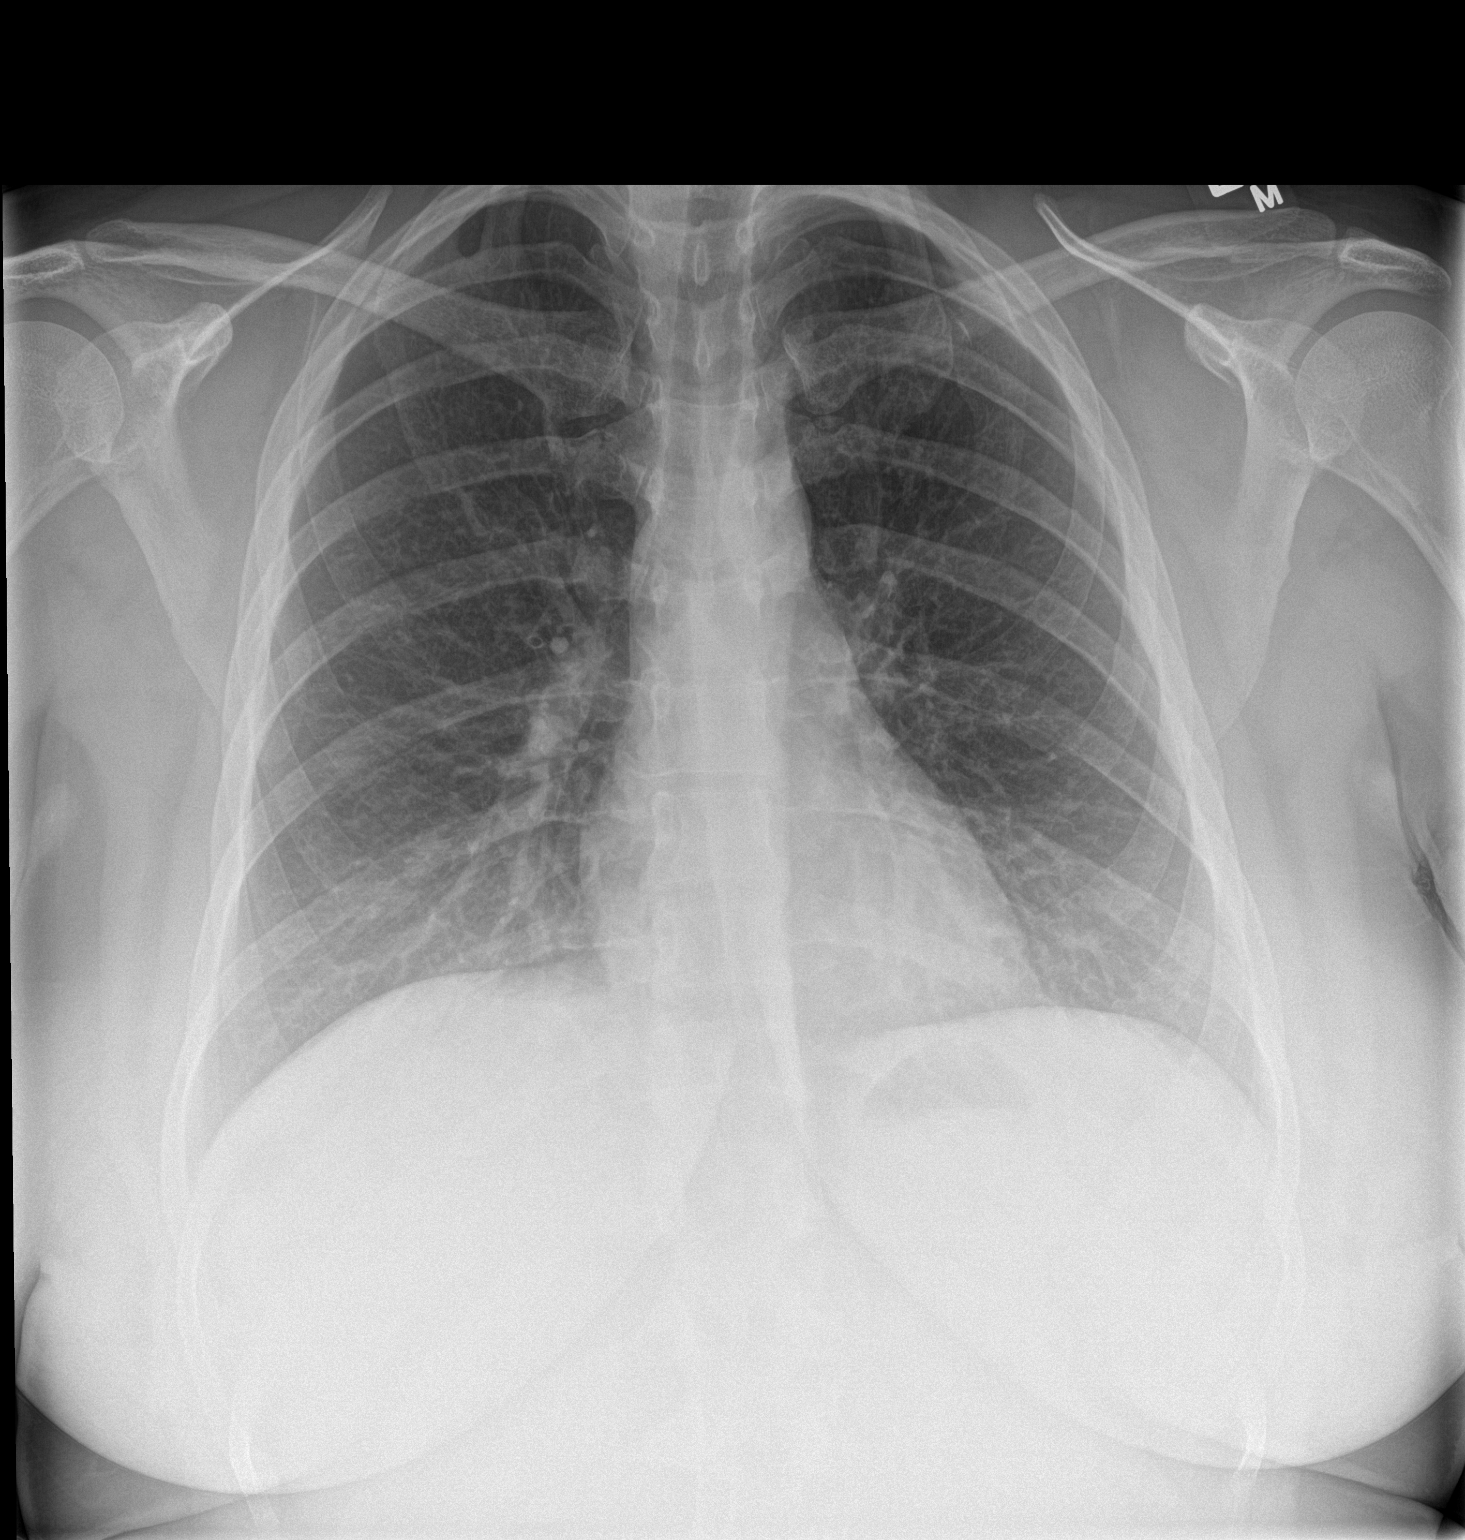

[chest lat]
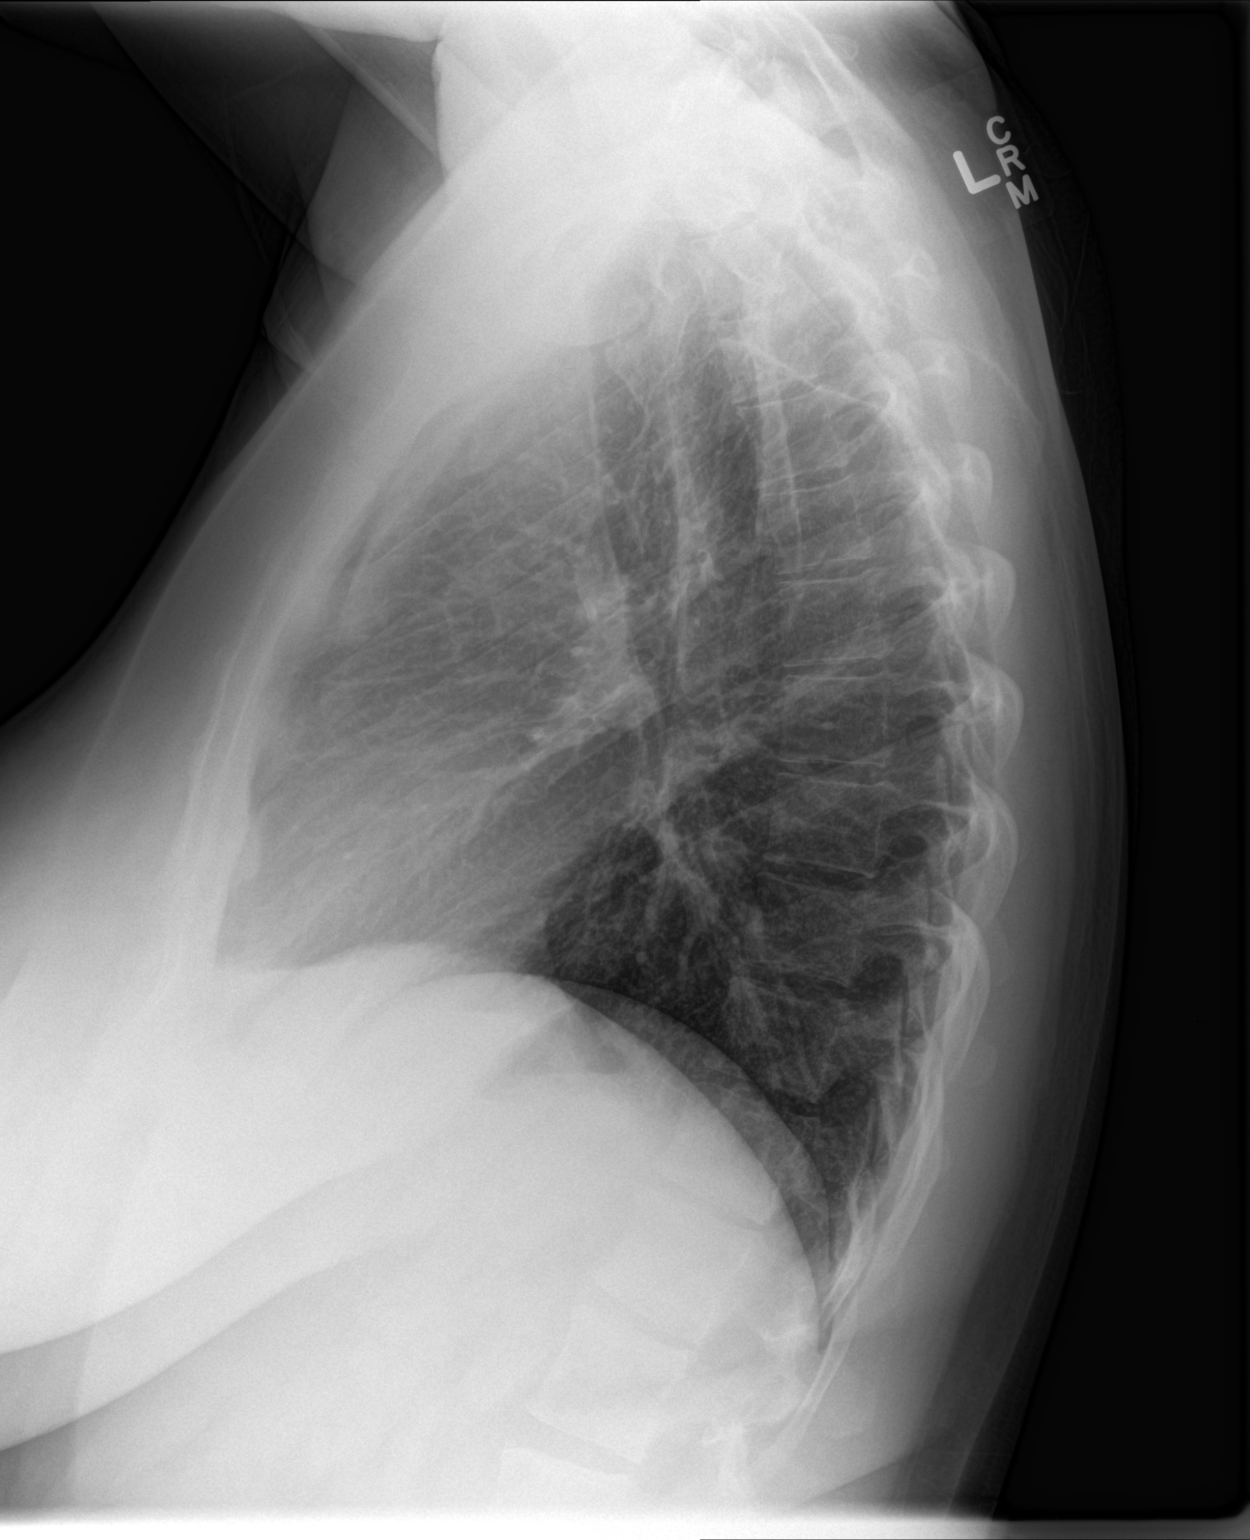

[2 of 2 positions shown; findings below may reference images not displayed]

FINDINGS: The heart size and mediastinal contours are within normal limits.
Both lungs are clear. The visualized skeletal structures are
unremarkable.
IMPRESSION: No active disease.

## 2016-03-24 LAB — OB RESULTS CONSOLE ANTIBODY SCREEN: Antibody Screen: NEGATIVE

## 2016-03-24 LAB — OB RESULTS CONSOLE GC/CHLAMYDIA
CHLAMYDIA, DNA PROBE: NEGATIVE
GC PROBE AMP, GENITAL: NEGATIVE

## 2016-03-24 LAB — OB RESULTS CONSOLE HIV ANTIBODY (ROUTINE TESTING): HIV: NONREACTIVE

## 2016-03-24 LAB — OB RESULTS CONSOLE RUBELLA ANTIBODY, IGM: RUBELLA: IMMUNE

## 2016-03-24 LAB — OB RESULTS CONSOLE ABO/RH: RH Type: POSITIVE

## 2016-03-24 LAB — OB RESULTS CONSOLE RPR: RPR: NONREACTIVE

## 2016-03-24 LAB — OB RESULTS CONSOLE HEPATITIS B SURFACE ANTIGEN: Hepatitis B Surface Ag: NEGATIVE

## 2016-10-03 ENCOUNTER — Encounter (HOSPITAL_COMMUNITY): Payer: Self-pay

## 2016-10-03 ENCOUNTER — Inpatient Hospital Stay (HOSPITAL_COMMUNITY)
Admission: AD | Admit: 2016-10-03 | Discharge: 2016-10-03 | Disposition: A | Discharge status: Home / Self Care | Payer: 59 | Source: Ambulatory Visit | Attending: Obstetrics and Gynecology | Admitting: Obstetrics and Gynecology

## 2016-10-03 DIAGNOSIS — Z79899 Other long term (current) drug therapy: Secondary | ICD-10-CM | POA: Insufficient documentation

## 2016-10-03 DIAGNOSIS — J45909 Unspecified asthma, uncomplicated: Secondary | ICD-10-CM | POA: Diagnosis not present

## 2016-10-03 DIAGNOSIS — F329 Major depressive disorder, single episode, unspecified: Secondary | ICD-10-CM | POA: Diagnosis not present

## 2016-10-03 DIAGNOSIS — O26893 Other specified pregnancy related conditions, third trimester: Secondary | ICD-10-CM | POA: Diagnosis not present

## 2016-10-03 DIAGNOSIS — O99513 Diseases of the respiratory system complicating pregnancy, third trimester: Secondary | ICD-10-CM | POA: Diagnosis not present

## 2016-10-03 DIAGNOSIS — O99343 Other mental disorders complicating pregnancy, third trimester: Secondary | ICD-10-CM | POA: Insufficient documentation

## 2016-10-03 DIAGNOSIS — O26899 Other specified pregnancy related conditions, unspecified trimester: Secondary | ICD-10-CM

## 2016-10-03 DIAGNOSIS — R109 Unspecified abdominal pain: Secondary | ICD-10-CM | POA: Diagnosis not present

## 2016-10-03 DIAGNOSIS — R102 Pelvic and perineal pain: Secondary | ICD-10-CM | POA: Insufficient documentation

## 2016-10-03 DIAGNOSIS — Z3A35 35 weeks gestation of pregnancy: Secondary | ICD-10-CM | POA: Diagnosis not present

## 2016-10-03 LAB — COMPREHENSIVE METABOLIC PANEL
ALBUMIN: 3.2 g/dL — AB (ref 3.5–5.0)
ALT: 13 U/L — ABNORMAL LOW (ref 14–54)
AST: 19 U/L (ref 15–41)
Alkaline Phosphatase: 109 U/L (ref 38–126)
Anion gap: 6 (ref 5–15)
BUN: 8 mg/dL (ref 6–20)
CHLORIDE: 104 mmol/L (ref 101–111)
CO2: 25 mmol/L (ref 22–32)
Calcium: 8.8 mg/dL — ABNORMAL LOW (ref 8.9–10.3)
Creatinine, Ser: 0.57 mg/dL (ref 0.44–1.00)
GFR calc non Af Amer: >60 mL/min (ref >60)
GLUCOSE: 117 mg/dL — AB (ref 65–99)
Potassium: 4.5 mmol/L (ref 3.5–5.1)
SODIUM: 135 mmol/L (ref 135–145)
Total Bilirubin: 0.7 mg/dL (ref 0.3–1.2)
Total Protein: 6.5 g/dL (ref 6.5–8.1)

## 2016-10-03 LAB — URINALYSIS, ROUTINE W REFLEX MICROSCOPIC
BILIRUBIN URINE: NEGATIVE
GLUCOSE, UA: NEGATIVE mg/dL
HGB URINE DIPSTICK: NEGATIVE
KETONES UR: NEGATIVE mg/dL
LEUKOCYTES UA: NEGATIVE
Nitrite: NEGATIVE
PROTEIN: NEGATIVE mg/dL
Specific Gravity, Urine: 1.01 (ref 1.005–1.030)
pH: 7 (ref 5.0–8.0)

## 2016-10-03 LAB — CBC
HCT: 32.2 % — ABNORMAL LOW (ref 36.0–46.0)
HEMOGLOBIN: 11 g/dL — AB (ref 12.0–15.0)
MCH: 29.8 pg (ref 26.0–34.0)
MCHC: 34.2 g/dL (ref 30.0–36.0)
MCV: 87.3 fL (ref 78.0–100.0)
Platelets: 151 10*3/uL (ref 150–400)
RBC: 3.69 MIL/uL — ABNORMAL LOW (ref 3.87–5.11)
RDW: 13.8 % (ref 11.5–15.5)
WBC: 11.7 10*3/uL — ABNORMAL HIGH (ref 4.0–10.5)

## 2016-10-03 MED ORDER — ACETAMINOPHEN 500 MG PO TABS
1000.0000 mg | ORAL_TABLET | Freq: Once | ORAL | Status: AC
  Administered 2016-10-03: 1000 mg via ORAL
  Filled 2016-10-03: qty 2

## 2016-10-03 NOTE — MAU Provider Note (Signed)
History     CSN: [id number]  Arrival date and time: 10/03/16 1954   First Provider Initiated Contact with Patient 10/03/16 2032      Chief Complaint  Patient presents with  . Pelvic Pain   Elizabeth [name] is a 36 y.o. G2P1001 at 5058w3d who presents today with right lower abdominal pain. She denies any VB or LOF. She reports normal fetal movement. Next appointment: 10/04/16    Pelvic Pain  The patient's primary symptoms include pelvic pain. The patient's pertinent negatives include no vaginal discharge. This is a new problem. The current episode started today. The problem occurs constantly. The problem has been gradually worsening. Pain severity now: 6/10 when sitting 9/10 when walking  The problem affects the right side. Associated symptoms include diarrhea. Pertinent negatives include no chills, dysuria, fever, frequency, nausea, urgency or vomiting. The vaginal discharge was normal. There has been no bleeding. The symptoms are aggravated by activity. She has tried nothing for the symptoms.    Past Medical History:  Diagnosis Date  . Allergy   . Asthma   . Depression   . Endometriosis   . Gestational diabetes   . Heart murmur   . Hx of cystitis   . Hx of varicella   . Migraine     Past Surgical History:  Procedure Laterality Date  . CESAREAN SECTION N/A 08/26/2013   Procedure: CESAREAN SECTION;  Surgeon: Meriel Picaichard M Holland, MD;  Location: [facility];  Service: Obstetrics;  Laterality: N/A;  . WISDOM TOOTH EXTRACTION      Family History  Problem Relation Age of Onset  . Arthritis Father   . Hypertension Father   . Thyroid disease Father   . Alcohol abuse Maternal Aunt   . Alcohol abuse Maternal Uncle   . Alcohol abuse Paternal Aunt   . Alcohol abuse Paternal Uncle   . Hyperlipidemia Maternal Grandmother   . Diabetes Maternal Grandmother   . Arthritis Maternal Grandfather   . Cancer Maternal Grandfather     lung  . Hyperlipidemia Maternal Grandfather   .  Hypertension Paternal Grandfather   . Alzheimer's disease Paternal Grandfather   . Alzheimer's disease Paternal Grandmother     Social History  Substance Use Topics  . Smoking status: Never Smoker  . Smokeless tobacco: Never Used  . Alcohol use No    Allergies:  Allergies  Allergen Reactions  . Tramadol Nausea And Vomiting    GI upset    Prescriptions Prior to Admission  Medication Sig Dispense Refill Last Dose  . albuterol (PROVENTIL HFA;VENTOLIN HFA) 108 (90 Base) MCG/ACT inhaler Inhale 2 puffs into the lungs every 6 (six) hours as needed for wheezing or shortness of breath. 1 Inhaler 1   . benzonatate (TESSALON) 100 MG capsule 1-2 caps tid as needed. 30 capsule 0   . fluticasone (FLONASE) 50 MCG/ACT nasal spray Place 1 spray into both nostrils 2 (two) times daily. 16 g 3   . ondansetron (ZOFRAN) 4 MG tablet Take 1 tablet (4 mg total) by mouth every 8 (eight) hours as needed for nausea or vomiting. 15 tablet 0   . predniSONE (DELTASONE) 20 MG tablet Take 2 tablets (40 mg total) by mouth daily with breakfast. 6 tablet 0     Review of Systems  Constitutional: Negative for chills and fever.  Gastrointestinal: Positive for diarrhea. Negative for nausea and vomiting.  Genitourinary: Positive for pelvic pain. Negative for dysuria, frequency, urgency, vaginal bleeding and vaginal discharge.   Physical Exam  Blood pressure 105/57, pulse 103, temperature 97.5 F (36.4 C), temperature source Oral, resp. rate 17.  Physical Exam  Nursing note and vitals reviewed. Constitutional: She is oriented to person, place, and time. She appears well-developed and well-nourished.  HENT:  Head: Normocephalic.  Cardiovascular: Normal rate.   Respiratory: Effort normal.  GI: Soft. There is no tenderness. There is no rebound.  Genitourinary:  Genitourinary Comments: Cervix: closed/thick/balottable   Neurological: She is alert and oriented to person, place, and time.  Skin: Skin is warm and  dry.  Psychiatric: She has a normal mood and affect.    FHT: 135, moderate with 15x15 accels, no decels Toco: no UCs   Results for orders placed or performed during the hospital encounter of 10/03/16 (from the past 24 hour(s))  Urinalysis, Routine w reflex microscopic     Status: None   Collection Time: 10/03/16  8:20 PM  Result Value Ref Range   Color, Urine YELLOW YELLOW   APPearance CLEAR CLEAR   Specific Gravity, Urine 1.010 1.005 - 1.030   pH 7.0 5.0 - 8.0   Glucose, UA NEGATIVE NEGATIVE mg/dL   Hgb urine dipstick NEGATIVE NEGATIVE   Bilirubin Urine NEGATIVE NEGATIVE   Ketones, ur NEGATIVE NEGATIVE mg/dL   Protein, ur NEGATIVE NEGATIVE mg/dL   Nitrite NEGATIVE NEGATIVE   Leukocytes, UA NEGATIVE NEGATIVE  CBC     Status: Abnormal   Collection Time: 10/03/16  8:45 PM  Result Value Ref Range   WBC 11.7 (H) 4.0 - 10.5 K/uL   RBC 3.69 (L) 3.87 - 5.11 MIL/uL   Hemoglobin 11.0 (L) 12.0 - 15.0 g/dL   HCT 69.6 (L) 29.5 - 28.4 %   MCV 87.3 78.0 - 100.0 fL   MCH 29.8 26.0 - 34.0 pg   MCHC 34.2 30.0 - 36.0 g/dL   RDW 13.2 44.0 - 10.2 %   Platelets 151 150 - 400 K/uL  Comprehensive metabolic panel     Status: Abnormal   Collection Time: 10/03/16  8:45 PM  Result Value Ref Range   Sodium 135 135 - 145 mmol/L   Potassium 4.5 3.5 - 5.1 mmol/L   Chloride 104 101 - 111 mmol/L   CO2 25 22 - 32 mmol/L   Glucose, Bld 117 (H) 65 - 99 mg/dL   BUN 8 6 - 20 mg/dL   Creatinine, Ser 7.25 0.44 - 1.00 mg/dL   Calcium 8.8 (L) 8.9 - 10.3 mg/dL   Total Protein 6.5 6.5 - 8.1 g/dL   Albumin 3.2 (L) 3.5 - 5.0 g/dL   AST 19 15 - 41 U/L   ALT 13 (L) 14 - 54 U/L   Alkaline Phosphatase 109 38 - 126 U/L   Total Bilirubin 0.7 0.3 - 1.2 mg/dL   GFR calc non Af Amer >60 >60 mL/min   GFR calc Af Amer >60 >60 mL/min   Anion gap 6 5 - 15    MAU Course  Procedures  MDM  2131: D/W Dr. Elon Spanner, ok for DC home.  Assessment and Plan   1. Abdominal pain in pregnancy, third trimester   2. Pain of  round ligament affecting pregnancy, antepartum   3. [redacted] weeks gestation of pregnancy    DC home Comfort measures reviewed  3rd Trimester precautions  PTL precautions  Fetal kick counts RX: none  Return to MAU as needed FU with OB as planned  Follow-up Information    Ranae Pila, MD Follow up.   Specialty:  Obstetrics and Gynecology  Contact information: [address] [place] [address] [phone]           Tawnya Crook 10/03/2016, 9:29 PM

## 2016-10-03 NOTE — MAU Note (Signed)
Pt presents complaining of right hip and abdominal pain that started this afternoon and is worse when she walks. Denies vaginal bleeding. Increased discharge but not abnormal. Reports less fetal movement the last few days but baby is still moving.

## 2016-10-03 NOTE — Discharge Instructions (Signed)

## 2016-11-09 ENCOUNTER — Telehealth (HOSPITAL_COMMUNITY): Payer: Self-pay | Admitting: *Deleted

## 2016-11-09 ENCOUNTER — Encounter (HOSPITAL_COMMUNITY): Payer: Self-pay | Admitting: *Deleted

## 2016-11-09 LAB — OB RESULTS CONSOLE GBS: STREP GROUP B AG: NEGATIVE

## 2016-11-09 NOTE — Telephone Encounter (Signed)
Preadmission screen  

## 2016-11-10 ENCOUNTER — Other Ambulatory Visit: Payer: Self-pay | Admitting: Obstetrics and Gynecology

## 2016-11-11 ENCOUNTER — Encounter (HOSPITAL_COMMUNITY): Payer: Self-pay

## 2016-11-11 ENCOUNTER — Inpatient Hospital Stay (HOSPITAL_COMMUNITY): Payer: 59 | Admitting: Anesthesiology

## 2016-11-11 ENCOUNTER — Encounter (HOSPITAL_COMMUNITY)
Admission: RE | Disposition: A | Discharge status: Home / Self Care | Payer: Self-pay | Source: Ambulatory Visit | Attending: Obstetrics and Gynecology

## 2016-11-11 ENCOUNTER — Inpatient Hospital Stay (HOSPITAL_COMMUNITY)
Admission: RE | Admit: 2016-11-11 | Discharge: 2016-11-13 | DRG: 766 | Disposition: A | Discharge status: Home / Self Care | Payer: 59 | Source: Ambulatory Visit | Attending: Obstetrics and Gynecology | Admitting: Obstetrics and Gynecology

## 2016-11-11 VITALS — BP 108/60 | HR 79 | Temp 98.0°F | Resp 18 | Ht 62.0 in | Wt 200.0 lb

## 2016-11-11 DIAGNOSIS — R339 Retention of urine, unspecified: Secondary | ICD-10-CM | POA: Diagnosis present

## 2016-11-11 DIAGNOSIS — Z8249 Family history of ischemic heart disease and other diseases of the circulatory system: Secondary | ICD-10-CM | POA: Diagnosis not present

## 2016-11-11 DIAGNOSIS — Z3A41 41 weeks gestation of pregnancy: Secondary | ICD-10-CM

## 2016-11-11 DIAGNOSIS — Z98891 History of uterine scar from previous surgery: Secondary | ICD-10-CM

## 2016-11-11 DIAGNOSIS — Z833 Family history of diabetes mellitus: Secondary | ICD-10-CM

## 2016-11-11 DIAGNOSIS — O34211 Maternal care for low transverse scar from previous cesarean delivery: Secondary | ICD-10-CM | POA: Diagnosis present

## 2016-11-11 DIAGNOSIS — O3663X0 Maternal care for excessive fetal growth, third trimester, not applicable or unspecified: Secondary | ICD-10-CM | POA: Diagnosis present

## 2016-11-11 DIAGNOSIS — O480 Post-term pregnancy: Principal | ICD-10-CM | POA: Diagnosis present

## 2016-11-11 LAB — CBC
HCT: 35.7 % — ABNORMAL LOW (ref 36.0–46.0)
Hemoglobin: 12 g/dL (ref 12.0–15.0)
MCH: 29.7 pg (ref 26.0–34.0)
MCHC: 33.6 g/dL (ref 30.0–36.0)
MCV: 88.4 fL (ref 78.0–100.0)
Platelets: 179 K/uL (ref 150–400)
RBC: 4.04 MIL/uL (ref 3.87–5.11)
RDW: 14.4 % (ref 11.5–15.5)
WBC: 13.8 K/uL — ABNORMAL HIGH (ref 4.0–10.5)

## 2016-11-11 LAB — TYPE AND SCREEN
ABO/RH(D): B POS
Antibody Screen: NEGATIVE

## 2016-11-11 SURGERY — Surgical Case
Anesthesia: Epidural

## 2016-11-11 MED ORDER — SODIUM CHLORIDE 0.9% FLUSH: 3.0000 mL | INTRAVENOUS | Status: DC | PRN

## 2016-11-11 MED ORDER — ONDANSETRON HCL 4 MG/2ML IJ SOLN
INTRAMUSCULAR | Status: AC
  Filled 2016-11-11: qty 2

## 2016-11-11 MED ORDER — DIPHENHYDRAMINE HCL 50 MG/ML IJ SOLN: 12.5000 mg | INTRAMUSCULAR | Status: DC | PRN

## 2016-11-11 MED ORDER — SIMETHICONE 80 MG PO CHEW: 80.0000 mg | CHEWABLE_TABLET | ORAL | Status: DC | PRN

## 2016-11-11 MED ORDER — FENTANYL CITRATE (PF) 100 MCG/2ML IJ SOLN
INTRAMUSCULAR | Status: DC | PRN
  Administered 2016-11-11: 100 ug via EPIDURAL

## 2016-11-11 MED ORDER — ONDANSETRON HCL 4 MG/2ML IJ SOLN
4.0000 mg | Freq: Four times a day (QID) | INTRAMUSCULAR | Status: DC | PRN
  Administered 2016-11-11 (×2): 4 mg via INTRAVENOUS
  Filled 2016-11-11: qty 2

## 2016-11-11 MED ORDER — PHENYLEPHRINE 8 MG IN D5W 100 ML (0.08MG/ML) PREMIX OPTIME
INJECTION | INTRAVENOUS | Status: AC
  Filled 2016-11-11: qty 100

## 2016-11-11 MED ORDER — SCOPOLAMINE 1 MG/3DAYS TD PT72
MEDICATED_PATCH | TRANSDERMAL | Status: DC | PRN
  Administered 2016-11-11: 1 via TRANSDERMAL

## 2016-11-11 MED ORDER — LIDOCAINE HCL (PF) 1 % IJ SOLN
INTRAMUSCULAR | Status: DC | PRN
  Administered 2016-11-11: 6 mL via EPIDURAL
  Administered 2016-11-11: 4 mL

## 2016-11-11 MED ORDER — ACETAMINOPHEN 325 MG PO TABS: 650.0000 mg | ORAL_TABLET | ORAL | Status: DC | PRN

## 2016-11-11 MED ORDER — ONDANSETRON HCL 4 MG/2ML IJ SOLN
INTRAMUSCULAR | Status: DC | PRN
Start: 2016-11-11 — End: 2016-11-11
  Administered 2016-11-11: 4 mg via INTRAVENOUS

## 2016-11-11 MED ORDER — LACTATED RINGERS IV SOLN
INTRAVENOUS | Status: DC | PRN
  Administered 2016-11-11: 15:00:00 via INTRAVENOUS

## 2016-11-11 MED ORDER — NALOXONE HCL 0.4 MG/ML IJ SOLN: 0.4000 mg | INTRAMUSCULAR | Status: DC | PRN

## 2016-11-11 MED ORDER — DIBUCAINE 1 % RE OINT: 1.0000 "application " | TOPICAL_OINTMENT | RECTAL | Status: DC | PRN

## 2016-11-11 MED ORDER — OXYTOCIN BOLUS FROM INFUSION: 500.0000 mL | Freq: Once | INTRAVENOUS | Status: DC

## 2016-11-11 MED ORDER — EPHEDRINE 5 MG/ML INJ: 10.0000 mg | INTRAVENOUS | Status: DC | PRN

## 2016-11-11 MED ORDER — PHENYLEPHRINE 40 MCG/ML (10ML) SYRINGE FOR IV PUSH (FOR BLOOD PRESSURE SUPPORT)
80.0000 ug | PREFILLED_SYRINGE | INTRAVENOUS | Status: DC | PRN
  Filled 2016-11-11 (×2): qty 10

## 2016-11-11 MED ORDER — OXYCODONE-ACETAMINOPHEN 5-325 MG PO TABS: 1.0000 | ORAL_TABLET | ORAL | Status: DC | PRN

## 2016-11-11 MED ORDER — ZOLPIDEM TARTRATE 5 MG PO TABS: 5.0000 mg | ORAL_TABLET | Freq: Every evening | ORAL | Status: DC | PRN

## 2016-11-11 MED ORDER — TERBUTALINE SULFATE 1 MG/ML IJ SOLN: 0.2500 mg | Freq: Once | INTRAMUSCULAR | Status: DC | PRN

## 2016-11-11 MED ORDER — OXYTOCIN 40 UNITS IN LACTATED RINGERS INFUSION - SIMPLE MED
1.0000 m[IU]/min | INTRAVENOUS | Status: DC
  Administered 2016-11-11: 2 m[IU]/min via INTRAVENOUS
  Filled 2016-11-11: qty 1000

## 2016-11-11 MED ORDER — LACTATED RINGERS IV SOLN
INTRAVENOUS | Status: DC | PRN
  Administered 2016-11-11: 14:00:00 via INTRAVENOUS

## 2016-11-11 MED ORDER — SIMETHICONE 80 MG PO CHEW
80.0000 mg | CHEWABLE_TABLET | ORAL | Status: DC
  Administered 2016-11-12 (×2): 80 mg via ORAL
  Filled 2016-11-11 (×2): qty 1

## 2016-11-11 MED ORDER — MEPERIDINE HCL 25 MG/ML IJ SOLN
6.2500 mg | INTRAMUSCULAR | Status: AC | PRN
  Administered 2016-11-11 (×2): 6.25 mg via INTRAVENOUS

## 2016-11-11 MED ORDER — FENTANYL 2.5 MCG/ML BUPIVACAINE 1/10 % EPIDURAL INFUSION (WH - ANES)
14.0000 mL/h | INTRAMUSCULAR | Status: DC | PRN
  Administered 2016-11-11: 14 mL/h via EPIDURAL
  Filled 2016-11-11: qty 100

## 2016-11-11 MED ORDER — MENTHOL 3 MG MT LOZG: 1.0000 | LOZENGE | OROMUCOSAL | Status: DC | PRN

## 2016-11-11 MED ORDER — NALOXONE HCL 2 MG/2ML IJ SOSY: 1.0000 ug/kg/h | PREFILLED_SYRINGE | INTRAVENOUS | Status: DC | PRN

## 2016-11-11 MED ORDER — HYDROMORPHONE HCL 1 MG/ML IJ SOLN: 0.2500 mg | INTRAMUSCULAR | Status: DC | PRN

## 2016-11-11 MED ORDER — WITCH HAZEL-GLYCERIN EX PADS: 1.0000 "application " | MEDICATED_PAD | CUTANEOUS | Status: DC | PRN

## 2016-11-11 MED ORDER — LACTATED RINGERS IV SOLN: INTRAVENOUS | Status: DC

## 2016-11-11 MED ORDER — PHENYLEPHRINE 40 MCG/ML (10ML) SYRINGE FOR IV PUSH (FOR BLOOD PRESSURE SUPPORT)
PREFILLED_SYRINGE | INTRAVENOUS | Status: AC
  Filled 2016-11-11: qty 10

## 2016-11-11 MED ORDER — ONDANSETRON HCL 4 MG/2ML IJ SOLN: 4.0000 mg | Freq: Three times a day (TID) | INTRAMUSCULAR | Status: DC | PRN

## 2016-11-11 MED ORDER — MEPERIDINE HCL 25 MG/ML IJ SOLN
INTRAMUSCULAR | Status: AC
  Administered 2016-11-11: 6.25 mg via INTRAVENOUS
  Filled 2016-11-11: qty 1

## 2016-11-11 MED ORDER — OXYTOCIN 40 UNITS IN LACTATED RINGERS INFUSION - SIMPLE MED: 2.5000 [IU]/h | INTRAVENOUS | Status: AC

## 2016-11-11 MED ORDER — OXYCODONE-ACETAMINOPHEN 5-325 MG PO TABS
2.0000 | ORAL_TABLET | ORAL | Status: DC | PRN
Start: 2016-11-11 — End: 2016-11-11

## 2016-11-11 MED ORDER — IBUPROFEN 600 MG PO TABS
600.0000 mg | ORAL_TABLET | Freq: Four times a day (QID) | ORAL | Status: DC
  Administered 2016-11-12 – 2016-11-13 (×6): 600 mg via ORAL
  Filled 2016-11-11 (×6): qty 1

## 2016-11-11 MED ORDER — PHENYLEPHRINE 40 MCG/ML (10ML) SYRINGE FOR IV PUSH (FOR BLOOD PRESSURE SUPPORT)
80.0000 ug | PREFILLED_SYRINGE | INTRAVENOUS | Status: DC | PRN
  Administered 2016-11-11: 80 ug via INTRAVENOUS
  Filled 2016-11-11: qty 10

## 2016-11-11 MED ORDER — SODIUM BICARBONATE 8.4 % IV SOLN
INTRAVENOUS | Status: DC | PRN
  Administered 2016-11-11 (×2): 5 mL via EPIDURAL

## 2016-11-11 MED ORDER — SOD CITRATE-CITRIC ACID 500-334 MG/5ML PO SOLN
30.0000 mL | ORAL | Status: DC | PRN
  Administered 2016-11-11: 30 mL via ORAL
  Filled 2016-11-11: qty 15

## 2016-11-11 MED ORDER — KETOROLAC TROMETHAMINE 30 MG/ML IJ SOLN
30.0000 mg | Freq: Once | INTRAMUSCULAR | Status: AC
  Administered 2016-11-11: 30 mg via INTRAMUSCULAR

## 2016-11-11 MED ORDER — DIPHENHYDRAMINE HCL 25 MG PO CAPS
25.0000 mg | ORAL_CAPSULE | ORAL | Status: DC | PRN
  Administered 2016-11-12 (×2): 25 mg via ORAL
  Filled 2016-11-11 (×2): qty 1

## 2016-11-11 MED ORDER — LACTATED RINGERS IV SOLN
500.0000 mL | Freq: Once | INTRAVENOUS | Status: AC
  Administered 2016-11-11: 500 mL via INTRAVENOUS

## 2016-11-11 MED ORDER — LIDOCAINE HCL (PF) 1 % IJ SOLN: 30.0000 mL | INTRAMUSCULAR | Status: DC | PRN

## 2016-11-11 MED ORDER — MORPHINE SULFATE (PF) 0.5 MG/ML IJ SOLN
INTRAMUSCULAR | Status: DC | PRN
  Administered 2016-11-11: 3 mg via EPIDURAL

## 2016-11-11 MED ORDER — SIMETHICONE 80 MG PO CHEW
80.0000 mg | CHEWABLE_TABLET | Freq: Three times a day (TID) | ORAL | Status: DC
  Administered 2016-11-11 – 2016-11-13 (×5): 80 mg via ORAL
  Filled 2016-11-11 (×5): qty 1

## 2016-11-11 MED ORDER — ACETAMINOPHEN 500 MG PO TABS
1000.0000 mg | ORAL_TABLET | Freq: Four times a day (QID) | ORAL | Status: AC
  Administered 2016-11-11 – 2016-11-12 (×3): 1000 mg via ORAL
  Filled 2016-11-11 (×3): qty 2

## 2016-11-11 MED ORDER — CEFAZOLIN SODIUM-DEXTROSE 2-4 GM/100ML-% IV SOLN
INTRAVENOUS | Status: AC
  Filled 2016-11-11: qty 100

## 2016-11-11 MED ORDER — LACTATED RINGERS IV SOLN: 500.0000 mL | INTRAVENOUS | Status: DC | PRN

## 2016-11-11 MED ORDER — COCONUT OIL OIL: 1.0000 "application " | TOPICAL_OIL | Status: DC | PRN

## 2016-11-11 MED ORDER — OXYTOCIN 40 UNITS IN LACTATED RINGERS INFUSION - SIMPLE MED: 2.5000 [IU]/h | INTRAVENOUS | Status: DC

## 2016-11-11 MED ORDER — LACTATED RINGERS IV SOLN
INTRAVENOUS | Status: DC | PRN
  Administered 2016-11-11: 40 [IU] via INTRAVENOUS

## 2016-11-11 MED ORDER — OXYTOCIN 10 UNIT/ML IJ SOLN
INTRAMUSCULAR | Status: AC
  Filled 2016-11-11: qty 4

## 2016-11-11 MED ORDER — ALBUTEROL SULFATE (2.5 MG/3ML) 0.083% IN NEBU: 3.0000 mL | INHALATION_SOLUTION | Freq: Four times a day (QID) | RESPIRATORY_TRACT | Status: DC | PRN

## 2016-11-11 MED ORDER — OXYCODONE-ACETAMINOPHEN 5-325 MG PO TABS
2.0000 | ORAL_TABLET | ORAL | Status: DC | PRN
  Administered 2016-11-12: 2 via ORAL
  Filled 2016-11-11: qty 2

## 2016-11-11 MED ORDER — SCOPOLAMINE 1 MG/3DAYS TD PT72: 1.0000 | MEDICATED_PATCH | Freq: Once | TRANSDERMAL | Status: DC

## 2016-11-11 MED ORDER — KETOROLAC TROMETHAMINE 30 MG/ML IJ SOLN
INTRAMUSCULAR | Status: AC
  Administered 2016-11-11: 30 mg via INTRAMUSCULAR
  Filled 2016-11-11: qty 1

## 2016-11-11 MED ORDER — TETANUS-DIPHTH-ACELL PERTUSSIS 5-2.5-18.5 LF-MCG/0.5 IM SUSP: 0.5000 mL | Freq: Once | INTRAMUSCULAR | Status: DC

## 2016-11-11 MED ORDER — PRENATAL MULTIVITAMIN CH
1.0000 | ORAL_TABLET | Freq: Every day | ORAL | Status: DC
  Administered 2016-11-12: 1 via ORAL
  Filled 2016-11-11: qty 1

## 2016-11-11 MED ORDER — FENTANYL CITRATE (PF) 100 MCG/2ML IJ SOLN
INTRAMUSCULAR | Status: AC
  Filled 2016-11-11: qty 2

## 2016-11-11 MED ORDER — DIPHENHYDRAMINE HCL 25 MG PO CAPS: 25.0000 mg | ORAL_CAPSULE | Freq: Four times a day (QID) | ORAL | Status: DC | PRN

## 2016-11-11 MED ORDER — MORPHINE SULFATE (PF) 0.5 MG/ML IJ SOLN
INTRAMUSCULAR | Status: AC
  Filled 2016-11-11: qty 10

## 2016-11-11 MED ORDER — LACTATED RINGERS IV SOLN
INTRAVENOUS | Status: DC
  Administered 2016-11-11 (×2): via INTRAVENOUS

## 2016-11-11 MED ORDER — OXYCODONE HCL 5 MG PO TABS
10.0000 mg | ORAL_TABLET | Freq: Four times a day (QID) | ORAL | Status: DC | PRN
  Administered 2016-11-11 – 2016-11-12 (×4): 10 mg via ORAL
  Filled 2016-11-11 (×4): qty 2

## 2016-11-11 MED ORDER — SENNOSIDES-DOCUSATE SODIUM 8.6-50 MG PO TABS
2.0000 | ORAL_TABLET | ORAL | Status: DC
  Administered 2016-11-12 (×2): 2 via ORAL
  Filled 2016-11-11 (×2): qty 2

## 2016-11-11 MED ORDER — SCOPOLAMINE 1 MG/3DAYS TD PT72
MEDICATED_PATCH | TRANSDERMAL | Status: AC
  Filled 2016-11-11: qty 1

## 2016-11-11 MED ORDER — CEFAZOLIN SODIUM-DEXTROSE 2-3 GM-% IV SOLR
INTRAVENOUS | Status: DC | PRN
  Administered 2016-11-11: 2 g via INTRAVENOUS

## 2016-11-11 MED ORDER — PHENYLEPHRINE HCL 10 MG/ML IJ SOLN
INTRAMUSCULAR | Status: DC | PRN
  Administered 2016-11-11: 80 ug via INTRAVENOUS

## 2016-11-11 SURGICAL SUPPLY — 33 items
BARRIER ADHS 3X4 INTERCEED (GAUZE/BANDAGES/DRESSINGS) IMPLANT
CHLORAPREP W/TINT 26ML (MISCELLANEOUS) ×3 IMPLANT
CLAMP CORD UMBIL (MISCELLANEOUS) IMPLANT
CLOTH BEACON ORANGE TIMEOUT ST (SAFETY) ×3 IMPLANT
CONTAINER PREFILL 10% NBF 15ML (MISCELLANEOUS) IMPLANT
DERMABOND ADVANCED (GAUZE/BANDAGES/DRESSINGS) ×2
DERMABOND ADVANCED .7 DNX12 (GAUZE/BANDAGES/DRESSINGS) ×1 IMPLANT
DRSG OPSITE POSTOP 4X10 (GAUZE/BANDAGES/DRESSINGS) ×3 IMPLANT
ELECT REM PT RETURN 9FT ADLT (ELECTROSURGICAL) ×3
ELECTRODE REM PT RTRN 9FT ADLT (ELECTROSURGICAL) ×1 IMPLANT
EXTRACTOR VACUUM M CUP 4 TUBE (SUCTIONS) IMPLANT
EXTRACTOR VACUUM M CUP 4' TUBE (SUCTIONS)
GLOVE BIO SURGEON STRL SZ 6.5 (GLOVE) ×2 IMPLANT
GLOVE BIO SURGEONS STRL SZ 6.5 (GLOVE) ×1
GLOVE BIOGEL PI IND STRL 7.0 (GLOVE) ×1 IMPLANT
GLOVE BIOGEL PI INDICATOR 7.0 (GLOVE) ×2
GOWN STRL REUS W/TWL LRG LVL3 (GOWN DISPOSABLE) ×6 IMPLANT
KIT ABG SYR 3ML LUER SLIP (SYRINGE) IMPLANT
NEEDLE HYPO 22GX1.5 SAFETY (NEEDLE) IMPLANT
NEEDLE HYPO 25X5/8 SAFETYGLIDE (NEEDLE) ×3 IMPLANT
NS IRRIG 1000ML POUR BTL (IV SOLUTION) ×3 IMPLANT
PACK C SECTION WH (CUSTOM PROCEDURE TRAY) ×3 IMPLANT
PAD OB MATERNITY 4.3X12.25 (PERSONAL CARE ITEMS) ×3 IMPLANT
PENCIL SMOKE EVAC W/HOLSTER (ELECTROSURGICAL) ×3 IMPLANT
SUT CHROMIC 0 CTX 36 (SUTURE) ×9 IMPLANT
SUT PLAIN 0 NONE (SUTURE) IMPLANT
SUT PLAIN 2 0 XLH (SUTURE) IMPLANT
SUT VIC AB 0 CT1 27 (SUTURE) ×6
SUT VIC AB 0 CT1 27XBRD ANBCTR (SUTURE) ×3 IMPLANT
SUT VIC AB 4-0 KS 27 (SUTURE) IMPLANT
SYR CONTROL 10ML LL (SYRINGE) IMPLANT
TOWEL OR 17X24 6PK STRL BLUE (TOWEL DISPOSABLE) ×3 IMPLANT
TRAY FOLEY CATH SILVER 14FR (SET/KITS/TRAYS/PACK) ×3 IMPLANT

## 2016-11-11 NOTE — Progress Notes (Signed)
FHR repetititve lates with decreased variability Unable to increase Pitocin to adequate [place] Cervix is 5 cm vertex is high Small amount of vaginal bleeding noted Will proceed with Repeat LTCS now Risks reviewed Consent signed

## 2016-11-11 NOTE — Brief Op Note (Signed)
11/11/2016  3:06 PM  PATIENT:  Elizabeth [name]  36 y.o. female  PRE-OPERATIVE DIAGNOSIS:   IUP at 41 weeks Previous C Section Failed VBAC Non Reassuring Fetal Heart Rate  POST-OPERATIVE DIAGNOSIS:  Same  PROCEDURE:  Procedure(s): CESAREAN SECTION (N/A)  SURGEON:  Surgeon(s) and Role:    * Marcelle OverlieMichelle Haidee Stogsdill, MD - Primary  PHYSICIAN ASSISTANT:   ASSISTANTS: none   ANESTHESIA:   epidural  EBL:  Total I/O In: 1300 [I.V.:1300] Out: 1450 [Urine:950; Blood:500]  BLOOD ADMINISTERED:none  DRAINS: Urinary Catheter (Foley)   LOCAL MEDICATIONS USED:  NONE  SPECIMEN:  No Specimen  DISPOSITION OF SPECIMEN:  N/A  COUNTS:  YES  TOURNIQUET:  * No tourniquets in log *  DICTATION: .Other Dictation: Dictation Number [id number]  PLAN OF CARE: Admit to inpatient   PATIENT DISPOSITION:  PACU - hemodynamically stable.   Delay start of Pharmacological VTE agent (>24hrs) due to surgical blood loss or risk of bleeding: yes

## 2016-11-11 NOTE — Anesthesia Pain Management Evaluation Note (Signed)
  [facility] Pain Management Visit Note  Patient: Elizabeth [name], 36 y.o., female  "Hello I am a [facility] of the anesthesia team at [facility]. We have an anesthesia team available at all times to provide care throughout the hospital, including epidural management and anesthesia for C-section. I don't know your plan for the delivery whether it a natural birth, water birth, IV sedation, nitrous supplementation, doula or epidural, but we want to meet your pain goals."   1.Was your pain managed to your expectations on prior hospitalizations?   Yes   2.What is your expectation for pain management during this hospitalization?     Epidural  3.How can we help you reach that goal? epidural  Record the patient's initial score and the patient's pain goal.   Pain: 3  Pain Goal: 3 [facility] wants you to be able to say your pain was always managed very well.  Zell Doucette 11/11/2016

## 2016-11-11 NOTE — H&P (Signed)
Elizabeth [name] is a 36 y.o. G 2 P 1 at 41 weeks presents for induction secondary to post dates. EFW last week 8 pound 9 oz.  She has history of a C Section and wants TOL. She has been consented about the risks of the procedure which include uterine rupture, maternal morbidity and fetal mortality and morbidity. OB History    Gravida Para Term Preterm AB Living   2 1 1     1    SAB TAB Ectopic Multiple Live Births           1     Past Medical History:  Diagnosis Date  . Allergy   . Asthma    inhaler rarely  . Depression   . Endometriosis   . Gestational diabetes    with prior pregnancy  . Heart murmur   . Hx of cystitis   . Hx of varicella   . Migraine    Past Surgical History:  Procedure Laterality Date  . CESAREAN SECTION N/A 08/26/2013   Procedure: CESAREAN SECTION;  Surgeon: Meriel Picaichard M Holland, MD;  Location: [facility];  Service: Obstetrics;  Laterality: N/A;  . WISDOM TOOTH EXTRACTION     Family History: family history includes Alcohol abuse in her maternal aunt, maternal uncle, paternal aunt, and paternal uncle; Alzheimer's disease in her paternal grandfather and paternal grandmother; Arthritis in her father and maternal grandfather; Cancer in her maternal grandfather; Diabetes in her maternal grandmother; Hyperlipidemia in her maternal grandfather and maternal grandmother; Hypertension in her father and paternal grandfather; Thyroid disease in her father. Social History:  reports that she has never smoked. She has never used smokeless tobacco. She reports that she does not drink alcohol or use drugs.     Maternal Diabetes: No Genetic Screening: Normal Maternal Ultrasounds/Referrals: Normal Fetal Ultrasounds or other Referrals:  None Maternal Substance Abuse:  No Significant Maternal Medications:  None Significant Maternal Lab Results:  None Other Comments:  None  Review of Systems  All other systems reviewed and are negative.  Maternal Medical History:  Fetal  activity: Perceived fetal activity is normal.    Prenatal complications: no prenatal complications   Dilation: 2 Station: Ballotable Exam by:: stone rnc Blood pressure 134/72, pulse (!) 107, temperature 98.6 F (37 C), temperature source Oral, resp. rate 18, height 5\' 2"  (1.575 m), weight 90.7 kg (200 lb). Maternal Exam:  Abdomen: Fetal presentation: vertex     Fetal Exam Fetal State Assessment: Category I - tracings are normal.     Physical Exam  Nursing note and vitals reviewed. Constitutional: She appears well-developed and well-nourished.  HENT:  Head: Normocephalic and atraumatic.  Eyes: Pupils are equal, round, and reactive to light.  Cardiovascular: Normal rate and regular rhythm.     Prenatal labs: ABO, Rh: B/Positive/-- (07/19 0000) Antibody: Negative (07/19 0000) Rubella: Immune (07/19 0000) RPR: Nonreactive (07/19 0000)  HBsAg: Negative (07/19 0000)  HIV: Non-reactive (07/19 0000)  GBS: Negative (03/06 0000)   Assessment/Plan: IUP at 41 weeks Post dates Induction  Previous C Section LGA  Pitocin with IUPC Recommend early epidural Follow labor curve closely Discussed with patient that EFW is a concern and that may increase likelihood of Repeat C Section   Elizabeth [name] 11/11/2016, 8:24 AM

## 2016-11-11 NOTE — Lactation Note (Signed)
This note was copied from a baby's chart. Lactation Consultation Note  Patient Name: Elizabeth [name] WUJWJ'XToday's Date: 11/11/2016 Reason for consult: Initial assessment   Initial assessment with Exp BF mom of 1 hour old infant. Infant being weighed as mom cold and feeling nauseated. Mom reports she BF her 36 yo for 2 years, they experienced a difficult latch in the beginning.   Mom with large compressible breasts and areola with flat nipples at rest, evert with stim. Glistening of colostrum noted with hand expression. Mom reports she is aware of how to hand express. Assisted mom in latching infant to right breast. Infant latched after a few tries, and suckled intermittently with stimulation. Infant is noted to want to latch to 1/2 of nipple, is better with gentle chin tug. Infant fed off and on for 15 minutes. At that time mom became ill and infant placed STS with FOB. Enc mom to feed infant STS 8-12 x in 24 hours at first feeding cues using pillow and head support offering both breasts with each feeding. Feeding log given with explanation for use.   BF Resources Hand out and [facility] given, informed of IP/OP Services. Mom may need reiteration of initial teaching as she was not feeling well throughout feeding Mom and dad without questions/concerns at this time. Enc them to call out to desk as needed for feeding assistance.   Mom reports she has a Medela PIS and has ordered an Ahmeda pump.    Maternal Data Formula Feeding for Exclusion: No Has patient been taught Hand Expression?: No Does the patient have breastfeeding experience prior to this delivery?: Yes  Feeding Feeding Type: Breast Fed Length of feed: 15 min  LATCH Score/Interventions Latch: Repeated attempts needed to sustain latch, nipple held in mouth throughout feeding, stimulation needed to elicit sucking reflex. Intervention(s): Adjust position;Assist with latch;Breast massage;Breast compression  Audible Swallowing: A few  with stimulation Intervention(s): Alternate breast massage;Hand expression  Type of Nipple: Everted at rest and after stimulation (flat at rest, everts with stim)  Comfort (Breast/Nipple): Soft / non-tender     Hold (Positioning): Assistance needed to correctly position infant at breast and maintain latch. Intervention(s): Breastfeeding basics reviewed;Support Pillows;Position options;Skin to skin  LATCH Score: 7  Lactation Tools Discussed/Used WIC Program: No   Consult Status Consult Status: Follow-up Date: 11/12/16 Follow-up type: In-patient    Silas FloodSharon S Carolanne Mercier 11/11/2016, 4:16 PM

## 2016-11-11 NOTE — Anesthesia Preprocedure Evaluation (Signed)
Anesthesia Evaluation  Patient identified by MRN, date of birth, ID band Patient awake    Reviewed: Allergy & Precautions, H&P , Patient's Chart, lab work & pertinent test results  Airway Mallampati: II  TM Distance: >3 FB Neck ROM: full    Dental  (+) Teeth Intact   Pulmonary asthma ,    breath sounds clear to auscultation       Cardiovascular  Rhythm:regular Rate:Normal     Neuro/Psych    GI/Hepatic   Endo/Other  diabetes  Renal/GU      Musculoskeletal   Abdominal   Peds  Hematology   Anesthesia Other Findings  Asthma TOLAC     Reproductive/Obstetrics (+) Pregnancy                             Anesthesia Physical Anesthesia Plan  ASA: III  Anesthesia Plan: Epidural   Post-op Pain Management:    Induction:   Airway Management Planned:   Additional Equipment:   Intra-op Plan:   Post-operative Plan:   Informed Consent: I have reviewed the patients History and Physical, chart, labs and discussed the procedure including the risks, benefits and alternatives for the proposed anesthesia with the patient or authorized representative who has indicated his/her understanding and acceptance.   Dental Advisory Given  Plan Discussed with:   Anesthesia Plan Comments: (Labs checked- platelets confirmed with RN in room. Fetal heart tracing, per RN, reported to be stable enough for sitting procedure. Discussed epidural, and patient consents to the procedure:  included risk of possible headache,backache, failed block, allergic reaction, and nerve injury. This patient was asked if she had any questions or concerns before the procedure started.)        Anesthesia Quick Evaluation

## 2016-11-11 NOTE — Anesthesia Procedure Notes (Signed)
Epidural Patient location during procedure: OB  Staffing Anesthesiologist: Elizabeth BlueJACKSON, Elizabeth [name]  Preanesthetic Checklist Completed: patient identified, site marked, surgical consent, pre-op evaluation, timeout performed, IV checked, risks and benefits discussed and monitors and equipment checked  Epidural Patient position: sitting Prep: site prepped and draped and DuraPrep Patient monitoring: continuous pulse ox and blood pressure Approach: midline Location: L3-L4 Injection technique: LOR air  Needle:  Needle type: Tuohy  Needle gauge: 17 G Needle length: 9 cm and 9 Needle insertion depth: 6 cm Catheter type: closed end flexible Catheter size: 19 Gauge Catheter at skin depth: 13 cm Test dose: negative  Assessment Events: blood not aspirated, injection not painful, no injection resistance, negative IV test and no paresthesia  Additional Notes Dosing of Epidural:  1st dose, through catheter ............................................Marland Kitchen.  Xylocaine 40 mg  2nd dose, through catheter, after waiting 3 minutes........Marland Kitchen.Xylocaine 60 mg    As each dose occurred, patient was free of IV sx; and patient exhibited no evidence of SA injection.  Patient is more comfortable after epidural dosed. Please see RN's note for documentation of vital signs,and FHR which are stable.  Patient reminded not to try to ambulate with numb legs, and that an RN must be present when she attempts to get up.

## 2016-11-11 NOTE — Transfer of Care (Signed)
Immediate Anesthesia Transfer of Care Note  Patient: Elizabeth [name]  Procedure(s) Performed: Procedure(s): CESAREAN SECTION (N/A)  Patient Location: PACU  Anesthesia Type:Epidural  Level of Consciousness: awake, alert , oriented and patient cooperative  Airway & Oxygen Therapy: Patient Spontanous Breathing  Post-op Assessment: Report given to RN and Post -op Vital signs reviewed and stable.  Pt complaining of numbness in face.  Pt with strong hand grip strength bilaterally.  No complaints of breathing difficulty, spo2 97-98% on RA with no breathing assistance needed, MDA and PACU RN made aware and pt advised that if she starts to have any trouble breathing or speaking to inform her RN.  Post vital signs: Reviewed and stable  Last Vitals:  Vitals:   11/11/16 1416 11/11/16 1421  BP: (!) 106/55 106/70  Pulse: 96 96  Resp:  18  Temp:  36.6 C    Last Pain:  Vitals:   11/11/16 1421  TempSrc: Oral  PainSc:          Complications: No apparent anesthesia complications

## 2016-11-11 NOTE — Op Note (Signed)
Elizabeth [name], Elizabeth [name]          ACCOUNT NO.:  [id number]  MEDICAL RECORD NO.:  [id number]  LOCATION:                                 FACILITY:  PHYSICIAN:  Vittorio Mohs L. Vincente PoliGrewal, M.D.    DATE OF BIRTH:  DATE OF PROCEDURE:  11/11/2016 DATE OF DISCHARGE:                              OPERATIVE REPORT   PREOPERATIVE DIAGNOSIS:  Intrauterine pregnancy at 41 weeks, previous cesarean section, failed VBAC and non-reassuring fetal heart rate.  POSTOPERATIVE DIAGNOSIS:  Intrauterine pregnancy at 41 weeks, previous cesarean section, failed VBAC and non-reassuring fetal heart rate.  PROCEDURE:  Repeat low transverse cesarean section.  SURGEON:  Marcelle OverlieMichelle Jackqueline Aquilar, M.D.  ANESTHESIA:  Epidural.  EBL:  Less than 500.  COMPLICATIONS:  None.  DRAINS:  Foley.  DESCRIPTION OF PROCEDURE:  The patient was taken to the operating room. She was prepped and draped in usual sterile fashion.  A Foley catheter had been inserted while on Labor and Delivery.  A low transverse incision was made, carried down to the fascia.  Fascia was scored in the midline and extended laterally.  Rectus muscles were separated in the midline.  The peritoneum was entered bluntly.  The peritoneal incision was then stretched.  The bladder blade was inserted.  The lower uterine segment was identified.  The bladder flap was created sharply and then digitally.  The bladder blade was then readjusted.  A low-transverse incision was made in the uterus.  The baby was in cephalic presentation, was delivered easily with large for gestational age.  There was a nuchal cord and there was cord wrapped around his leg.  The baby was a female infant, Apgars 9 at 1 minute, 9 at 5 minutes.  The cord was clamped and cut.  The baby was handed to the neonatal team.  The placenta was manually removed noted to be normal intact with a three-vessel cord. The uterus was exteriorized and cleared of all clots and debris.  The uterine incision was noted  to be hemostatic and after it was closed with 2 layers using 0 chromic in a running locked stitch. The uterus was returned to the abdomen.  Irrigation was performed.  The fascia was closed using 0 Vicryl in a running lock stitch.  The skin was closed with 3-0 Vicryl on a Keith needle.  All sponge, lap, and instrument counts were correct x2.  The patient went to recovery room in stable condition.     Thaison Kolodziejski L. Vincente PoliGrewal, M.D.     Florestine AversMLG/MEDQ  D:  11/11/2016  T:  11/11/2016  Job:  [id number]

## 2016-11-11 NOTE — Progress Notes (Signed)
Notified Dr. Vincente PoliGrewal of orthostatic BP and pulses, pt c/o lightheadedness upon sitting and standing, bleeding and urine output normal. MD ordered 500 ml LR bolus.    11/11/16 2148 11/11/16 2159  Vital Signs  BP (!) 115/45 98/60  BP Location Left Arm Left Arm  Patient Position (if appropriate) Lying Standing  Pulse Rate 91 100  Orthostatic Sitting  BP- Sitting 117/58 --   Pulse- Sitting 107 --   Orthostatic Standing at 3 minutes  BP- Standing at 3 minutes (!) 127/107 --   Pulse- Standing at 3 minutes 122 --

## 2016-11-12 ENCOUNTER — Inpatient Hospital Stay (HOSPITAL_COMMUNITY): Admission: RE | Admit: 2016-11-12 | Payer: 59 | Source: Ambulatory Visit

## 2016-11-12 LAB — CBC
HCT: 28.8 % — ABNORMAL LOW (ref 36.0–46.0)
HEMOGLOBIN: 9.8 g/dL — AB (ref 12.0–15.0)
MCH: 30 pg (ref 26.0–34.0)
MCHC: 34 g/dL (ref 30.0–36.0)
MCV: 88.1 fL (ref 78.0–100.0)
PLATELETS: 122 10*3/uL — AB (ref 150–400)
RBC: 3.27 MIL/uL — AB (ref 3.87–5.11)
RDW: 14.5 % (ref 11.5–15.5)
WBC: 13.7 10*3/uL — AB (ref 4.0–10.5)

## 2016-11-12 LAB — RPR: RPR Ser Ql: NONREACTIVE

## 2016-11-12 LAB — RUBELLA SCREEN: Rubella: 4.16 index (ref >0.99)

## 2016-11-12 MED ORDER — HYDROCODONE-ACETAMINOPHEN 5-325 MG PO TABS
1.0000 | ORAL_TABLET | ORAL | Status: DC | PRN
  Administered 2016-11-13 (×2): 2 via ORAL
  Filled 2016-11-12 (×2): qty 2

## 2016-11-12 NOTE — Lactation Note (Signed)
This note was copied from a baby's chart. Lactation Consultation Note   Follow up visit at 27 hours. Mom reports baby has had a lot of feedings, with some nipple soreness. Baby observed latched with large amount of areola visible.  Mom unlatch baby to show LC latch on, and noted baby has shallow latch. LC discussed waiting for wide open mouth prior to latch.  LC assisted with positioning to allow baby to get closer to breast.  Baby need chin tug to widen gape when latched.  With breast compressions mom was able to identify intermittent audible swallows.  Mom reports feedings during the night being very long and having nipple pain.  LC expressed importance of keeping baby active at the breast with more efficient feedings.  LC discussed importance of getting a deep latching and watching for good feedings.  Mom voiced understanding.  Baby sleep on mom.       Patient Name: Elizabeth [name] XBJYN'WToday's Date: 11/12/2016 Reason for consult: Follow-up assessment   Maternal Data Has patient been taught Hand Expression?: Yes  Feeding Feeding Type: Breast Fed Length of feed: 12 min  LATCH Score/Interventions Latch: Grasps breast easily, tongue down, lips flanged, rhythmical sucking. Intervention(s): Adjust position;Assist with latch;Breast massage;Breast compression  Audible Swallowing: A few with stimulation  Type of Nipple: Everted at rest and after stimulation  Comfort (Breast/Nipple): Soft / non-tender     Hold (Positioning): Assistance needed to correctly position infant at breast and maintain latch. Intervention(s): Breastfeeding basics reviewed;Support Pillows  LATCH Score: 8  Lactation Tools Discussed/Used     Consult Status Consult Status: Follow-up Date: 11/13/16 Follow-up type: In-patient    Shoptaw, Elizabeth [name] 11/12/2016, 6:12 PM

## 2016-11-12 NOTE — Progress Notes (Signed)
Subjective: Postpartum Day 1: Cesarean Delivery Patient reports tolerating PO and + flatus.  Unable to void yet.  Objective: Vital signs in last 24 hours: Temp:  [94.5 F (34.7 C)-98.4 F (36.9 C)] 98.3 F (36.8 C) (03/09 0800) Pulse Rate:  [70-105] 92 (03/09 0800) Resp:  [15-24] 18 (03/09 0800) BP: (86-139)/(45-80) 106/48 (03/09 0800) SpO2:  [96 %-100 %] 98 % (03/09 0800)  Physical Exam:  General: alert, cooperative and no distress Lochia: appropriate Uterine Fundus: firm Incision: healing well DVT Evaluation: No evidence of DVT seen on physical exam. Abdomen soft, good BS, incision looks good   Recent Labs  11/11/16 0742 11/12/16 0542  HGB 12.0 9.8*  HCT 35.7* 28.8*    Assessment/Plan: Status post Cesarean section. Postoperative course complicated by post operative urinary retention so far. D/W patient bladder trial.  Continue current care.  Milayna Rotenberg II,Jewelz Ricklefs E 11/12/2016, 8:40 AM

## 2016-11-12 NOTE — Anesthesia Postprocedure Evaluation (Signed)
Anesthesia Post Note  Patient: Elizabeth [name]  Procedure(s) Performed: Procedure(s) (LRB): CESAREAN SECTION (N/A)  Patient location during evaluation: Mother Baby Anesthesia Type: Epidural Level of consciousness: awake, awake and alert, oriented and patient cooperative Pain management: pain level controlled Vital Signs Assessment: post-procedure vital signs reviewed and stable Respiratory status: spontaneous breathing, nonlabored ventilation and respiratory function stable Cardiovascular status: stable Postop Assessment: no headache, no backache, patient able to bend at knees and no signs of nausea or vomiting Anesthetic complications: no        Last Vitals:  Vitals:   11/12/16 0800 11/12/16 1202  BP: (!) 106/48 (!) 92/46  Pulse: 92 88  Resp: 18 18  Temp: 36.8 C 36.8 C    Last Pain:  Vitals:   11/12/16 1202  TempSrc: Axillary  PainSc:    Pain Goal:                 Marcha Licklider L

## 2016-11-12 NOTE — Lactation Note (Signed)
This note was copied from a baby's chart. Lactation Consultation Note  Patient Name: Elizabeth [name] MochaMackenzie Dillie ZOXWR'UToday's Date: 11/12/2016  Mom states feedings are going well.  No questions at present.  Encouraged to call with concerns/assist.   Maternal Data    Feeding Feeding Type: Breast Fed Length of feed: 20 min  LATCH Score/Interventions Latch: Grasps breast easily, tongue down, lips flanged, rhythmical sucking.  Audible Swallowing: A few with stimulation  Type of Nipple: Everted at rest and after stimulation  Comfort (Breast/Nipple): Soft / non-tender     Hold (Positioning): No assistance needed to correctly position infant at breast.  LATCH Score: 9  Lactation Tools Discussed/Used     Consult Status      Huston FoleyMOULDEN, Avyan Livesay S 11/12/2016, 2:30 PM

## 2016-11-12 NOTE — Progress Notes (Signed)
Notified Dr. Henderson Cloudomblin of increased pain despite percocet and motrin. Ordered vicodin and D/C'd percocet. Will continue to monitor.

## 2016-11-12 NOTE — Progress Notes (Signed)
MOB was referred for history of depression/anxiety. * Referral screened out by Clinical Social Worker because none of the following criteria appear to apply: ~ History of anxiety/depression during this pregnancy, or of post-partum depression. ~ Diagnosis of anxiety and/or depression within last 3 years OR * MOB's symptoms currently being treated with medication and/or therapy. Please contact the Clinical Social Worker if needs arise, or if MOB requests.   

## 2016-11-13 LAB — BIRTH TISSUE RECOVERY COLLECTION (PLACENTA DONATION)

## 2016-11-13 MED ORDER — IBUPROFEN 600 MG PO TABS: 600.0000 mg | ORAL_TABLET | Freq: Four times a day (QID) | ORAL | 0 refills | Status: DC | PRN

## 2016-11-13 MED ORDER — HYDROCODONE-ACETAMINOPHEN 5-325 MG PO TABS: 1.0000 | ORAL_TABLET | Freq: Four times a day (QID) | ORAL | 0 refills | Status: DC | PRN

## 2016-11-13 NOTE — Discharge Summary (Signed)
Obstetric Discharge Summary Reason for Admission: onset of labor Prenatal Procedures: none Intrapartum Procedures: cesarean: low cervical, transverse Postpartum Procedures: none Complications-Operative and Postpartum: none Hemoglobin  Date Value Ref Range Status  11/12/2016 9.8 (L) 12.0 - 15.0 g/dL Final   HCT  Date Value Ref Range Status  11/12/2016 28.8 (L) 36.0 - 46.0 % Final    Physical Exam:  General: alert, cooperative and no distress Lochia: appropriate Uterine Fundus: firm Incision: healing well DVT Evaluation: No evidence of DVT seen on physical exam.  Discharge Diagnoses: Term Pregnancy-delivered  Discharge Information: Date: 11/13/2016 Activity: pelvic rest Diet: routine Medications: PNV, Ibuprofen and Vicodin Condition: stable Instructions: refer to practice specific booklet Discharge to: home   Newborn Data: Live born female  Birth Weight: 8 lb 6.2 oz (3805 g) APGAR: 8, 10  Home with mother.  Martine Trageser II,Kaycen Whitworth E 11/13/2016, 7:58 AM

## 2016-11-13 NOTE — Lactation Note (Signed)
This note was copied from a baby's chart. Lactation Consultation Note  Mom continues to have nipple pain.  Reviewed latching and chin tug and she reported pain decreased significantly. Comfort gels given with instructions for use. Reminded mom of outpatient services. Patient Name: Elizabeth [name] ZOXWR'UToday's Date: 11/13/2016 Reason for consult: Follow-up assessment   Maternal Data    Feeding Feeding Type: Breast Fed Length of feed: 20 min  LATCH Score/Interventions Latch: Repeated attempts needed to sustain latch, nipple held in mouth throughout feeding, stimulation needed to elicit sucking reflex.  Audible Swallowing: A few with stimulation  Type of Nipple: Everted at rest and after stimulation  Comfort (Breast/Nipple): Filling, red/small blisters or bruises, mild/mod discomfort     Hold (Positioning): No assistance needed to correctly position infant at breast.  LATCH Score: 7  Lactation Tools Discussed/Used     Consult Status Consult Status: Complete    Soyla DryerJoseph, Mayci Haning 11/13/2016, 10:31 AM

## 2016-11-13 NOTE — Progress Notes (Signed)
Voiding well Good pain relief with Vicodin Wants to go home VSS Afeb D/C home

## 2016-12-20 ENCOUNTER — Telehealth: Payer: Self-pay | Admitting: *Deleted

## 2016-12-20 NOTE — Telephone Encounter (Signed)
Called and LVM for patient letting her know appt tomorrow cx due to office closed, no power. Will try and call back to r/s. Apologized for any inconvenience.

## 2016-12-21 ENCOUNTER — Ambulatory Visit: Payer: 59 | Admitting: Neurology

## 2016-12-21 ENCOUNTER — Encounter (INDEPENDENT_AMBULATORY_CARE_PROVIDER_SITE_OTHER): Payer: Self-pay

## 2016-12-21 ENCOUNTER — Encounter: Payer: Self-pay | Admitting: Neurology

## 2016-12-21 ENCOUNTER — Ambulatory Visit (INDEPENDENT_AMBULATORY_CARE_PROVIDER_SITE_OTHER): Payer: 59 | Admitting: Neurology

## 2016-12-21 VITALS — BP 118/73 | HR 85 | Ht 63.0 in | Wt 190.5 lb

## 2016-12-21 DIAGNOSIS — R202 Paresthesia of skin: Secondary | ICD-10-CM | POA: Diagnosis not present

## 2016-12-21 NOTE — Progress Notes (Signed)
Reason for visit: Sacral numbness  Referring physician: Dr. [name] is a 36 y.o. female  History of present illness:   Elizabeth [name] is a 36 year old right handed white female with a history of a recent delivery by C-section on 11/11/16. She claims that immediately after the procedure she noted numbness in the lower back and groin area. The patient had no discomfort in this region, she denied any difficulty controlling the bowels or the bladder. She believes that there has been some mild sensory alteration of the bladder, however. The patient denies any numbness or discomfort down either leg. Over time, she has had gradual improvement of the size of the area of numbness, but the very lowest part of the back is still with decreased sensation. The patient does have some intermittent numbness of the right hand and occasionally with the right arm, but this has occurred off and on in the past. The patient denies any numbness on the body or on the face. She denies any vision changes or problems with dizziness or mentation. She denies any significant balance issues, and no falls. She is not having any pain at this point. The patient delivered by C-section, she had an epidural anesthetic procedure. This is the second C-section. The patient is sent to this office for further evaluation. Since the delivery, she has had frequent migraine headaches, these are occurring daily. Usually, her migraine headaches are hormonal in nature.  Past Medical History:  Diagnosis Date  . Allergy   . Asthma    inhaler rarely  . Depression   . Endometriosis   . Gestational diabetes    with prior pregnancy  . Heart murmur   . Hx of cystitis   . Hx of varicella   . Migraine     Past Surgical History:  Procedure Laterality Date  . CESAREAN SECTION N/A 08/26/2013   Procedure: CESAREAN SECTION;  Surgeon: Meriel Pica, MD;  Location: [facility];  Service: Obstetrics;  Laterality: N/A;  . CESAREAN  SECTION N/A 11/11/2016   Procedure: CESAREAN SECTION;  Surgeon: Marcelle Overlie, MD;  Location: [facility] BIRTHING SUITES;  Service: Obstetrics;  Laterality: N/A;  . WISDOM TOOTH EXTRACTION      Family History  Problem Relation Age of Onset  . Arthritis Father   . Hypertension Father   . Thyroid disease Father   . Alcohol abuse Maternal Aunt   . Alcohol abuse Maternal Uncle   . Alcohol abuse Paternal Aunt   . Alcohol abuse Paternal Uncle   . Hyperlipidemia Maternal Grandmother   . Diabetes Maternal Grandmother   . Arthritis Maternal Grandfather   . Cancer Maternal Grandfather     lung  . Hyperlipidemia Maternal Grandfather   . Hypertension Paternal Grandfather   . Alzheimer's disease Paternal Grandfather   . Alzheimer's disease Paternal Grandmother     Social history:  reports that she has never smoked. She has never used smokeless tobacco. She reports that she does not drink alcohol or use drugs.  Medications:  Prior to Admission medications   Medication Sig Start Date End Date Taking? Authorizing Provider  albuterol (PROVENTIL HFA;VENTOLIN HFA) 108 (90 Base) MCG/ACT inhaler Inhale 2 puffs into the lungs every 6 (six) hours as needed for wheezing or shortness of breath. 12/23/15  Yes Betty G [place], MD  Prenatal Vit-Fe Fumarate-FA (PRENATAL MULTIVITAMIN) TABS tablet Take 1 tablet by mouth daily at 12 noon.   Yes Historical Provider, MD  Allergies  Allergen Reactions  . Tramadol Nausea And Vomiting    GI upset    ROS:  Out of a complete 14 system review of symptoms, the patient complains only of the following symptoms, and all other reviewed systems are negative.  Fatigue Eye discomfort Feeling hot Joint pain, joint swelling Headache, numbness, weakness Anxiety, not enough sleep  Blood pressure 118/73, pulse 85, height  (1.6 m), weight 190 lb 8 oz (86.4 kg), unknown if currently breastfeeding.  Physical Exam  General: The patient is alert and cooperative at the  time of the examination.  Eyes: Pupils are equal, round, and reactive to light. Discs are flat bilaterally.  Neck: The neck is supple, no carotid bruits are noted.  Respiratory: The respiratory examination is clear.  Cardiovascular: The cardiovascular examination reveals a regular rate and rhythm, no obvious murmurs or rubs are noted.  Neuromuscular: Range of movement of the lumbar spine is full.  Skin: Extremities are without significant edema.  Neurologic Exam  Mental status: The patient is alert and oriented x 3 at the time of the examination. The patient has apparent normal recent and remote memory, with an apparently normal attention span and concentration ability.  Cranial nerves: Facial symmetry is present. There is good sensation of the face to pinprick and soft touch bilaterally. The strength of the facial muscles and the muscles to head turning and shoulder shrug are normal bilaterally. Speech is well enunciated, no aphasia or dysarthria is noted. Extraocular movements are full. Visual fields are full. The tongue is midline, and the patient has symmetric elevation of the soft palate. No obvious hearing deficits are noted.  Motor: The motor testing reveals 5 over 5 strength of all 4 extremities. Good symmetric motor tone is noted throughout.  Sensory: Sensory testing is intact to pinprick, soft touch, vibration sensation, and position sense on all 4 extremities. No evidence of extinction is noted.  Coordination: Cerebellar testing reveals good finger-nose-finger and heel-to-shin bilaterally.  Gait and station: Gait is normal. Tandem gait is normal. Romberg is negative. No drift is seen. The patient is able to walk on heels and toes bilaterally.  Reflexes: Deep tendon reflexes are symmetric and normal bilaterally. Toes are downgoing bilaterally.   Assessment/Plan:  1. Low back numbness  2. Migraine headache  The patient gives a history of some sensory alteration in the  lower sacral nerve root distribution which has improved somewhat over time following delivery. The patient clinically has a normal examination, she does not report any issues controlling the bowels or the bladder. At this point, I would watch the patient conservatively, I would consider MRI evaluation of the lumbar spine if symptoms changed or worsened. If the migraine headaches do not abate, we will see her back for this issue as well.  Marlan Palau MD 12/21/2016 3:53 PM  [facility] [address] [place], [place] [address]  Phone [phone] Fax [phone]

## 2016-12-21 NOTE — Telephone Encounter (Signed)
Called pt. Scheduled appt for today at 4pm, check in 330pm. She verbalized understanding. Advised her to bring insurance cards, copay, updated med list.

## 2016-12-21 NOTE — Patient Instructions (Signed)
   Call for any new or changing symptoms.

## 2017-03-18 NOTE — Addendum Note (Signed)
Addendum  created 03/18/17 1509 by Cristela BlueJackson, Stepfanie Yott, MD   Sign clinical note

## 2017-03-18 NOTE — Anesthesia Postprocedure Evaluation (Signed)
Anesthesia Post Note  Patient: Elizabeth [name]  Procedure(s) Performed: Procedure(s) (LRB): CESAREAN SECTION (N/A)     Anesthesia Post Evaluation  Last Vitals:  Vitals:   11/12/16 1700 11/13/16 0638  BP: 107/69 108/60  Pulse: 92 79  Resp: 18 18  Temp: 36.7 C 36.7 C    Last Pain:  Vitals:   11/13/16 0638  TempSrc: Oral  PainSc:                  Jiles GarterJACKSON,Demaria Deeney EDWARD

## 2017-06-12 ENCOUNTER — Inpatient Hospital Stay (HOSPITAL_COMMUNITY)
Admission: AD | Admit: 2017-06-12 | Discharge: 2017-06-12 | Disposition: A | Discharge status: Home / Self Care | Payer: 59 | Source: Ambulatory Visit | Attending: Obstetrics & Gynecology | Admitting: Obstetrics & Gynecology

## 2017-06-12 ENCOUNTER — Encounter (HOSPITAL_COMMUNITY): Payer: Self-pay

## 2017-06-12 DIAGNOSIS — B372 Candidiasis of skin and nail: Secondary | ICD-10-CM | POA: Diagnosis not present

## 2017-06-12 DIAGNOSIS — O8600 Infection of obstetric surgical wound, unspecified: Secondary | ICD-10-CM | POA: Diagnosis not present

## 2017-06-12 DIAGNOSIS — O9089 Other complications of the puerperium, not elsewhere classified: Secondary | ICD-10-CM | POA: Diagnosis present

## 2017-06-12 DIAGNOSIS — T8141XA Infection following a procedure, superficial incisional surgical site, initial encounter: Secondary | ICD-10-CM

## 2017-06-12 MED ORDER — CLOTRIMAZOLE 1 % EX CREA: 1.0000 "application " | TOPICAL_CREAM | Freq: Two times a day (BID) | CUTANEOUS | 0 refills | Status: AC

## 2017-06-12 MED ORDER — FLUCONAZOLE 100 MG PO TABS: 100.0000 mg | ORAL_TABLET | Freq: Once | ORAL | 0 refills | Status: AC

## 2017-06-12 MED ORDER — SULFAMETHOXAZOLE-TRIMETHOPRIM 800-160 MG PO TABS: 1.0000 | ORAL_TABLET | Freq: Two times a day (BID) | ORAL | Status: DC

## 2017-06-12 NOTE — MAU Provider Note (Signed)
History     CSN: [id number]  Arrival date and time: 06/12/17 1911   First Provider Initiated Contact with Patient 06/12/17 2031      Chief Complaint  Patient presents with  . Drainage from Incision    HPI: Elizabeth [name] is a 36 y.o. G2P2002 s/p LTCS 6 months who presents to maternity admissions reporting purulent drainage for C/S incision site. She reports that she had an infection at her incision site a few weeks after C/S, for which she was on antibiotics. This then healed well, but she first started having some pain at the incision site about a week ago, then noticed some redness, and today has a small amount of purulent drainage with foul odor, followed by small amount of blood. She cleaned this off, and has not noticed additional drainage. Denies any fever, chills, malaise, fatigue, sweats, nausea, vomiting, diarrhea, abdominal pain, dizziness, lightheadedness, or any other concerns.  Past obstetric history: OB History  Gravida Para Term Preterm AB Living  SAB TAB Ectopic Multiple Live Births        0 2    # Outcome Date GA Lbr Len/2nd Weight Sex Delivery Anes PTL Lv  2 Term 11/11/16 [redacted]w[redacted]d  8 lb 6.2 oz (3.805 kg) M CS-LTranv EPI  LIV  1 Term 08/26/13 [redacted]w[redacted]d  7 lb 7.9 oz (3.4 kg) M CS-LTranv EPI  LIV      Past Medical History:  Diagnosis Date  . Allergy   . Asthma    inhaler rarely  . Depression   . Endometriosis   . Gestational diabetes    with prior pregnancy  . Heart murmur   . Hx of cystitis   . Hx of varicella   . Migraine     Past Surgical History:  Procedure Laterality Date  . CESAREAN SECTION N/A 08/26/2013   Procedure: CESAREAN SECTION;  Surgeon: Meriel Pica, MD;  Location: [facility];  Service: Obstetrics;  Laterality: N/A;  . CESAREAN SECTION N/A 11/11/2016   Procedure: CESAREAN SECTION;  Surgeon: Marcelle Overlie, MD;  Location: [facility] BIRTHING SUITES;  Service: Obstetrics;  Laterality: N/A;  . WISDOM TOOTH EXTRACTION      Family  History  Problem Relation Age of Onset  . Arthritis Father   . Hypertension Father   . Thyroid disease Father   . Alcohol abuse Maternal Aunt   . Alcohol abuse Maternal Uncle   . Alcohol abuse Paternal Aunt   . Alcohol abuse Paternal Uncle   . Hyperlipidemia Maternal Grandmother   . Diabetes Maternal Grandmother   . Arthritis Maternal Grandfather   . Cancer Maternal Grandfather        lung  . Hyperlipidemia Maternal Grandfather   . Hypertension Paternal Grandfather   . Alzheimer's disease Paternal Grandfather   . Alzheimer's disease Paternal Grandmother     Social History  Substance Use Topics  . Smoking status: Never Smoker  . Smokeless tobacco: Never Used  . Alcohol use No    Allergies:  Allergies  Allergen Reactions  . Tramadol Nausea And Vomiting    GI upset    Prescriptions Prior to Admission  Medication Sig Dispense Refill Last Dose  . albuterol (PROVENTIL HFA;VENTOLIN HFA) 108 (90 Base) MCG/ACT inhaler Inhale 2 puffs into the lungs every 6 (six) hours as needed for wheezing or shortness of breath. 1 Inhaler 1 Taking  . Prenatal Vit-Fe Fumarate-FA (PRENATAL MULTIVITAMIN) TABS tablet Take 1 tablet by mouth daily at  12 noon.   Taking    Review of Systems - Negative except for what is mentioned in HPI.  Physical Exam   Blood pressure 121/72, pulse 83, temperature 98 F (36.7 C), temperature source Oral, resp. rate 18, height  (1.6 m), weight 167 lb (75.8 kg), SpO2 100 %, unknown if currently breastfeeding.  Constitutional: Well-developed, well-nourished female in no acute distress.  HENT: [place]/AT, normal oropharynx mucosa. MMM Eyes: normal conjunctivae, no scleral icterus Cardiovascular: normal rate, regular rhythm Respiratory: normal effort, nor respiratory distress GI: Abd soft, non-tender,nonditended   MSK: Extremities nontender, no edema Neurologic: Alert and oriented x 4. Psych: Normal mood and affect Skin: low transverse cesarean incision with  surrounding macular papular erythematous rash w/o increase in warmth, TTP, no drainage noted. Underlying incision scar well-healed.   MAU Course  Procedures  MDM Incision site with surrounding candidal intertrigo. No obvious site overlying cellulitis, but patient reports purulent drainage earlier today.  VSS, pt nontoxic appearing and no symptoms of systemic infection.   Assessment and Plan  Assessment: 36 y.o. [id number] 2-months s/p LTCS presenting with candidal intertrigo around LTCS incision site with some concern for early overlying cellulitis.   Plan: --Rx: clotrimazole 1% topical BID. Instruction also given to keep area dry; dry well with towel after shower and several times a day.  --Rx for bactrim po for possible evolving superimposed bacterial infection --Discharge home in stable condition.  --Discussed return precautions   Korinne Greenstein, Kandra Nicolas, MD 06/12/2017 8:43 PM

## 2017-06-12 NOTE — Discharge Instructions (Signed)
Skin Yeast Infection Skin yeast infection is a condition in which there is an overgrowth of yeast (candida) that normally lives on the skin. This condition usually occurs in areas of the skin that are constantly warm and moist, such as the armpits or the groin. What are the causes? This condition is caused by a change in the normal balance of the yeast and bacteria that live on the skin. What increases the risk? This condition is more likely to develop in:  People who are obese.  Pregnant women.  Women who take birth control pills.  People who have diabetes.  People who take antibiotic medicines.  People who take steroid medicines.  People who are malnourished.  People who have a weak defense (immune) system.  People who are 65 years of age or older.  What are the signs or symptoms? Symptoms of this condition include:  A red, swollen area of the skin.  Bumps on the skin.  Itchiness.  How is this diagnosed? This condition is diagnosed with a medical history and physical exam. Your health care provider may check for yeast by taking light scrapings of the skin to be viewed under a microscope. How is this treated? This condition is treated with medicine. Medicines may be prescribed or be available over-the-counter. The medicines may be:  Taken by mouth (orally).  Applied as a cream.  Follow these instructions at home:  Take or apply over-the-counter and prescription medicines only as told by your health care provider.  Eat more yogurt. This may help to keep your yeast infection from returning.  Maintain a healthy weight. If you need help losing weight, talk with your health care provider.  Keep your skin clean and dry.  If you have diabetes, keep your blood sugar under control. Contact a health care provider if:  Your symptoms go away and then return.  Your symptoms do not get better with treatment.  Your symptoms get worse.  Your rash spreads.  You have a  fever or chills.  You have new symptoms.  You have new warmth or redness of your skin. This information is not intended to replace advice given to you by your health care provider. Make sure you discuss any questions you have with your health care provider. Document Released: 05/11/2011 Document Revised: 04/18/2016 Document Reviewed: 02/24/2015 Elsevier Interactive Patient Education  2018 Elsevier Inc.  

## 2017-06-12 NOTE — MAU Note (Signed)
Pt here with c/o pain and drainage/bleeding from C/S incision site; odor noted as well. C/S was 7 months ago.

## 2017-06-12 NOTE — Progress Notes (Addendum)
G2P2 non-pregnant with previous c/s x2. Last c/s 7 months ago.  Presents to triage for purulent c/s wound with drainage.  Was previously tx with abx but incision infected again.   2034: Provider at bs assessing pt.

## 2017-10-03 ENCOUNTER — Ambulatory Visit: Payer: Self-pay | Admitting: *Deleted

## 2017-10-03 NOTE — Telephone Encounter (Signed)
Pt reports increased episodes of dizziness, onset Thursday, more frequent today. Episodes last 10-20 sec... "About every 2 hours." Has congestion, no fever, no cough, reports "ears a little clogged." Headache 5/10, h/o migraines. Unable to check blood pressure. States"can be low at times."  HR checked during call..80. Not positional, can occur at rest at times.  Agent made appt with Dr. Caryl NeverBurchette for tomorrow 815.  Pt declined an appt today. Care advice given per protocol; instructed to call back if symptoms worsen.  Reason for Disposition . [1] MODERATE dizziness (e.g., interferes with normal activities) AND [2] has NOT been evaluated by physician for this  (Exception: dizziness caused by heat exposure, sudden standing, or poor fluid intake)  Answer Assessment - Initial Assessment Questions 1. DESCRIPTION: "Describe your dizziness."     "Room spinning" 2. LIGHTHEADED: "Do you feel lightheaded?" (e.g., somewhat faint, woozy, weak upon standing)     yes 3. VERTIGO: "Do you feel like either you or the room is spinning or tilting?" (i.e. vertigo)     yes 4. SEVERITY: "How bad is it?"  "Do you feel like you are going to faint?" "Can you stand and walk?"   - MILD - walking normally   - MODERATE - interferes with normal activities (e.g., work, school)    - SEVERE - unable to stand, requires support to walk, feels like passing out now.      "I have to hold on to things to walk when episode occurs." 5. ONSET:  "When did the dizziness begin?"     4 days ago, worsening now. 6. AGGRAVATING FACTORS: "Does anything make it worse?" (e.g., standing, change in head position)     No 7. HEART RATE: "Can you tell me your heart rate?" "How many beats in 15 seconds?"  (Note: not all patients can do this)       80 8. CAUSE: "What do you think is causing the dizziness?"     Unsure 9. RECURRENT SYMPTOM: "Have you had dizziness before?" If so, ask: "When was the last time?" "What happened that time?"     "Fainted  before 8 years ago allergic reaction." 10. OTHER SYMPTOMS: "Do you have any other symptoms?" (e.g., fever, chest pain, vomiting, diarrhea, bleeding)       Congestion, ears little clogged. Headache 5/10 11. PREGNANCY: "Is there any chance you are pregnant?" "When was your last menstrual period?"       no  Protocols used: DIZZINESS [facility]- LIGHTHEADEDNESS-A-AH

## 2017-10-04 ENCOUNTER — Ambulatory Visit: Payer: 59 | Admitting: Family Medicine

## 2017-10-04 ENCOUNTER — Encounter: Payer: Self-pay | Admitting: Family Medicine

## 2017-10-04 VITALS — BP 110/70 | HR 98 | Temp 98.2°F | Wt 169.4 lb

## 2017-10-04 DIAGNOSIS — R42 Dizziness and giddiness: Secondary | ICD-10-CM

## 2017-10-04 DIAGNOSIS — J069 Acute upper respiratory infection, unspecified: Secondary | ICD-10-CM

## 2017-10-04 NOTE — Progress Notes (Signed)
Subjective:     Patient ID: Elizabeth [name], female   DOB: [birth year], 37 y.o.   MRN: [id number]  HPI Patient seen with onset of cold-like symptoms last Thursday. She's had some nasal congestion and cough. Increased malaise. No fever. She came in mostly because some dizziness which started couple days ago. She has what sounds more like vertigo type symptoms. No syncope. No headache. She's not noticed whether this is directional. She has occasional associated nausea. No vomiting.  No ataxia and no focal weakness.  Dizziness exacerbated by movement.  No alleviating factors.  Past Medical History:  Diagnosis Date  . Allergy   . Asthma    inhaler rarely  . Depression   . Endometriosis   . Gestational diabetes    with prior pregnancy  . Heart murmur   . Hx of cystitis   . Hx of varicella   . Migraine    Past Surgical History:  Procedure Laterality Date  . CESAREAN SECTION N/A 08/26/2013   Procedure: CESAREAN SECTION;  Surgeon: Meriel Picaichard M Holland, MD;  Location: [facility];  Service: Obstetrics;  Laterality: N/A;  . CESAREAN SECTION N/A 11/11/2016   Procedure: CESAREAN SECTION;  Surgeon: Marcelle OverlieMichelle Grewal, MD;  [facility];  Service: Obstetrics;  Laterality: N/A;  . WISDOM TOOTH EXTRACTION      reports that  has never smoked. she has never used smokeless tobacco. She reports that she does not drink alcohol or use drugs. family history includes Alcohol abuse in her maternal aunt, maternal uncle, paternal aunt, and paternal uncle; Alzheimer's disease in her paternal grandfather and paternal grandmother; Arthritis in her father and maternal grandfather; Cancer in her maternal grandfather; Diabetes in her maternal grandmother; Hyperlipidemia in her maternal grandfather and maternal grandmother; Hypertension in her father and paternal grandfather; Thyroid disease in her father. Allergies  Allergen Reactions  . Tramadol Nausea And Vomiting    GI upset     Review of Systems   Constitutional: Negative for chills and fever.  HENT: Positive for congestion.   Respiratory: Positive for cough.   Gastrointestinal: Positive for nausea. Negative for vomiting.  Neurological: Positive for dizziness. Negative for weakness.       Objective:   Physical Exam  Constitutional: She is oriented to person, place, and time. She appears well-developed and well-nourished.  HENT:  Right Ear: External ear normal.  Left Ear: External ear normal.  Mouth/Throat: Oropharynx is clear and moist.  Eyes: Pupils are equal, round, and reactive to light.  Neck: Neck supple.  Cardiovascular: Normal rate and regular rhythm.  Pulmonary/Chest: Effort normal and breath sounds normal. No respiratory distress. She has no wheezes. She has no rales.  Neurological: She is alert and oriented to person, place, and time. No cranial nerve deficit. Coordination normal.  Patient has positive Dix Hallpike maneuver with head to the left       Assessment:     #1 viral URI  #2 probable benign peripheral positional vertigo to the left    Plan:     -try Epley maneuvers with handout given -Treat cold symptomatically -Touch base if symptoms not resolving in the next couple of weeks with Epley maneuvers.  Kristian CoveyBruce W Karinna Beadles MD [facility]

## 2017-10-04 NOTE — Patient Instructions (Signed)
